# Patient Record
Sex: Female | Born: 1960 | Race: White | Smoking: Never smoker
Health system: Northeastern US, Academic
[De-identification: ages and names within clinical notes are randomized; demographics above are authoritative.]

## PROBLEM LIST (undated history)

## (undated) DIAGNOSIS — F509 Eating disorder, unspecified: Secondary | ICD-10-CM

## (undated) DIAGNOSIS — E785 Hyperlipidemia, unspecified: Secondary | ICD-10-CM

## (undated) DIAGNOSIS — A491 Streptococcal infection, unspecified site: Secondary | ICD-10-CM

## (undated) DIAGNOSIS — J309 Allergic rhinitis, unspecified: Secondary | ICD-10-CM

## (undated) DIAGNOSIS — J069 Acute upper respiratory infection, unspecified: Secondary | ICD-10-CM

## (undated) DIAGNOSIS — W57XXXA Bitten or stung by nonvenomous insect and other nonvenomous arthropods, initial encounter: Secondary | ICD-10-CM

## (undated) DIAGNOSIS — R5383 Other fatigue: Secondary | ICD-10-CM

## (undated) DIAGNOSIS — E039 Hypothyroidism, unspecified: Secondary | ICD-10-CM

## (undated) DIAGNOSIS — H9202 Otalgia, left ear: Secondary | ICD-10-CM

## (undated) DIAGNOSIS — N83209 Unspecified ovarian cyst, unspecified side: Secondary | ICD-10-CM

## (undated) DIAGNOSIS — R059 Cough, unspecified: Secondary | ICD-10-CM

## (undated) DIAGNOSIS — B349 Viral infection, unspecified: Secondary | ICD-10-CM

## (undated) DIAGNOSIS — G4700 Insomnia, unspecified: Secondary | ICD-10-CM

## (undated) DIAGNOSIS — U071 COVID-19: Secondary | ICD-10-CM

## (undated) DIAGNOSIS — K37 Unspecified appendicitis: Secondary | ICD-10-CM

## (undated) DIAGNOSIS — K5900 Constipation, unspecified: Secondary | ICD-10-CM

## (undated) DIAGNOSIS — F419 Anxiety disorder, unspecified: Secondary | ICD-10-CM

## (undated) DIAGNOSIS — B2700 Gammaherpesviral mononucleosis without complication: Secondary | ICD-10-CM

## (undated) DIAGNOSIS — K219 Gastro-esophageal reflux disease without esophagitis: Secondary | ICD-10-CM

## (undated) DIAGNOSIS — F32A Depression, unspecified: Secondary | ICD-10-CM

## (undated) DIAGNOSIS — F1020 Alcohol dependence, uncomplicated: Secondary | ICD-10-CM

## (undated) DIAGNOSIS — J45909 Unspecified asthma, uncomplicated: Secondary | ICD-10-CM

## (undated) DIAGNOSIS — M940 Chondrocostal junction syndrome [Tietze]: Secondary | ICD-10-CM

## (undated) DIAGNOSIS — N951 Menopausal and female climacteric states: Secondary | ICD-10-CM

## (undated) DIAGNOSIS — R7989 Other specified abnormal findings of blood chemistry: Secondary | ICD-10-CM

## (undated) DIAGNOSIS — J029 Acute pharyngitis, unspecified: Secondary | ICD-10-CM

## (undated) DIAGNOSIS — F39 Unspecified mood [affective] disorder: Secondary | ICD-10-CM

## (undated) DIAGNOSIS — M6740 Ganglion, unspecified site: Secondary | ICD-10-CM

## (undated) HISTORY — DX: Allergic rhinitis, unspecified: J30.9

## (undated) HISTORY — DX: Bitten or stung by nonvenomous insect and other nonvenomous arthropods, initial encounter: W57.XXXA

## (undated) HISTORY — DX: Unspecified ovarian cyst, unspecified side: N83.209

## (undated) HISTORY — PX: PELVIC LAPAROSCOPY: SHX162

## (undated) HISTORY — DX: Gammaherpesviral mononucleosis without complication: B27.00

## (undated) HISTORY — DX: Hyperlipidemia, unspecified: E78.5

## (undated) HISTORY — DX: Otalgia, left ear: H92.02

## (undated) HISTORY — DX: Other specified abnormal findings of blood chemistry: R79.89

## (undated) HISTORY — PX: APPENDECTOMY: SHX54

## (undated) HISTORY — DX: COVID-19: U07.1

## (undated) HISTORY — DX: Streptococcal infection, unspecified site: A49.1

## (undated) HISTORY — DX: Acute pharyngitis, unspecified: J02.9

## (undated) HISTORY — DX: Eating disorder, unspecified: F50.9

## (undated) HISTORY — DX: Insomnia, unspecified: G47.00

## (undated) HISTORY — DX: Menopausal and female climacteric states: N95.1

## (undated) HISTORY — DX: Unspecified appendicitis: K37

## (undated) HISTORY — DX: Constipation, unspecified: K59.00

## (undated) HISTORY — DX: Other fatigue: R53.83

## (undated) HISTORY — DX: Depression, unspecified: F32.A

## (undated) HISTORY — DX: Unspecified asthma, uncomplicated: J45.909

## (undated) HISTORY — DX: Gastro-esophageal reflux disease without esophagitis: K21.9

## (undated) HISTORY — DX: Chondrocostal junction syndrome (tietze): M94.0

## (undated) HISTORY — DX: Cough, unspecified: R05.9

## (undated) HISTORY — DX: Viral infection, unspecified: B34.9

## (undated) HISTORY — DX: Unspecified mood (affective) disorder: F39

## (undated) HISTORY — DX: Hypothyroidism, unspecified: E03.9

## (undated) HISTORY — DX: Alcohol dependence, uncomplicated: F10.20

## (undated) HISTORY — DX: Acute upper respiratory infection, unspecified: J06.9

## (undated) HISTORY — DX: Ganglion, unspecified site: M67.40

---

## 2006-10-21 ENCOUNTER — Other Ambulatory Visit: Admission: RE | Admit: 2006-10-21 | Discharge: 2006-10-21 | Payer: Self-pay | Admitting: Obstetrics and Gynecology

## 2008-04-14 ENCOUNTER — Other Ambulatory Visit: Admission: RE | Admit: 2008-04-14 | Discharge: 2008-04-14 | Payer: Self-pay | Admitting: Obstetrics and Gynecology

## 2008-04-18 ENCOUNTER — Ambulatory Visit: Payer: Self-pay | Admitting: Obstetrics and Gynecology

## 2008-04-21 ENCOUNTER — Ambulatory Visit (HOSPITAL_COMMUNITY): Admission: RE | Admit: 2008-04-21 | Discharge: 2008-04-21 | Payer: Self-pay | Admitting: Obstetrics and Gynecology

## 2008-04-25 ENCOUNTER — Ambulatory Visit: Payer: Self-pay | Admitting: Obstetrics and Gynecology

## 2008-04-27 ENCOUNTER — Encounter: Admission: RE | Admit: 2008-04-27 | Discharge: 2008-04-27 | Payer: Self-pay | Admitting: Obstetrics and Gynecology

## 2010-04-23 ENCOUNTER — Ambulatory Visit: Payer: Self-pay | Admitting: Obstetrics and Gynecology

## 2010-04-23 ENCOUNTER — Other Ambulatory Visit: Admission: RE | Admit: 2010-04-23 | Discharge: 2010-04-23 | Payer: Self-pay | Admitting: Obstetrics and Gynecology

## 2010-06-10 ENCOUNTER — Encounter: Admission: RE | Admit: 2010-06-10 | Discharge: 2010-06-10 | Payer: Self-pay | Admitting: Obstetrics and Gynecology

## 2010-06-19 ENCOUNTER — Encounter: Admission: RE | Admit: 2010-06-19 | Discharge: 2010-06-19 | Payer: Self-pay | Admitting: Obstetrics and Gynecology

## 2010-09-02 ENCOUNTER — Ambulatory Visit
Admission: RE | Admit: 2010-09-02 | Discharge: 2010-09-02 | Payer: Self-pay | Source: Home / Self Care | Attending: Vascular Surgery | Admitting: Vascular Surgery

## 2010-09-02 ENCOUNTER — Ambulatory Visit: Admit: 2010-09-02 | Payer: Self-pay | Admitting: Vascular Surgery

## 2010-09-10 NOTE — Procedures (Unsigned)
LOWER EXTREMITY VENOUS REFLUX EXAM  INDICATION:  Varicose veins.  EXAM:  Using color-flow imaging and pulse Doppler spectral analysis, the right common femoral, superficial femoral, popliteal, posterior tibial, greater and lesser saphenous veins are evaluated.  There is no evidence suggesting deep venous insufficiency in the right lower extremity.  The right saphenofemoral junction is not competent with Reflux of >52milliseconds. The right GSV is competent.  The right proximal short saphenous vein demonstrates competency.  GSV Diameter (used if found to be incompetent only)                                           Right          Left Proximal Greater Saphenous Vein           0.66 cm        cm Proximal-to-mid-thigh                     cm             cm Mid thigh                                 cm             cm Mid-distal thigh                          cm             cm Distal thigh                              cm             cm Knee                                      cm             cm  IMPRESSION: 1. Right greater saphenous vein is competent. 2. The right greater saphenous vein is not tortuous. 3. The deep venous system is competent. 4. The right short saphenous vein is competent.  ___________________________________________ Quita Skye. Hart Rochester, M.D.  OD/MEDQ  D:  09/03/2010  T:  09/03/2010  Job:  [id number]

## 2010-11-29 ENCOUNTER — Other Ambulatory Visit: Payer: Self-pay | Admitting: Obstetrics and Gynecology

## 2010-11-29 DIAGNOSIS — Z09 Encounter for follow-up examination after completed treatment for conditions other than malignant neoplasm: Secondary | ICD-10-CM

## 2010-12-04 ENCOUNTER — Ambulatory Visit
Admission: RE | Admit: 2010-12-04 | Discharge: 2010-12-04 | Disposition: A | Discharge status: Home / Self Care | Payer: BC Managed Care – PPO | Source: Ambulatory Visit | Attending: Obstetrics and Gynecology | Admitting: Obstetrics and Gynecology

## 2010-12-04 DIAGNOSIS — Z09 Encounter for follow-up examination after completed treatment for conditions other than malignant neoplasm: Secondary | ICD-10-CM

## 2010-12-31 NOTE — Consult Note (Signed)
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  She has had no history of thrombophlebitis, deep vein thrombosis,  skin ulceration, bleeding or distal edema.  She does have a throbbing  discomfort in this area which radiates down the thigh which worsens as  the day progresses.  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  She does  not wear elastic compression stockings but does occasionally take  ibuprofen and elevate the leg with some improvement.   CHRONIC MEDICAL PROBLEMS:  Denies diabetes, hypertension, coronary  artery disease, COPD or stroke.  Does have some borderline  hyperlipidemia.   SOCIAL HISTORY:  She is single, has 2 children.  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  Does not use tobacco or alcohol.   FAMILY HISTORY:  Positive for TIAs and diabetes in her mother and  coronary artery disease in a brother.   REVIEW OF SYSTEMS:  Positive for leg discomfort with walking, joint  pain.  History of ADD as well as depression and anxiety which has been  treated.  All other systems in a complete review of systems are  negative.   PHYSICAL EXAMINATION:  Blood pressure 116/68, heart rate 67,  respirations 20.  General:  She is a well-developed, well-nourished thin  female who is in no apparent distress.  She is alert and oriented x3.  HEENT:  Normal for age.  EOMs intact.  Lungs:  Clear to auscultation.  No rhonchi or wheezing.  Cardiovascular:  Regular rhythm.  No murmurs.  Carotid pulses 3+.  No audible bruits.  Abdomen:  Soft, nontender with  no masses.  Musculoskeletal:  Free of major  deformities.  Neurologic:  Normal.  Skin:  Free of rashes.  Extremities:  Lower extremity exam  reveals 3+ femoral, popliteal and dorsalis pedis pulses palpable.  There  is no distal edema,  hyperpigmentation or ulceration.  No bulging  varicosities are noted.  She does have a linear, reticular-type vein  which begins at the right saphenofemoral junction and extends anteriorly  down the thigh and lateral to the knee.   I ordered venous duplex exam which I reviewed and interpreted also  performed a bedside SonoSite ultrasound exam.  She had some mild reflux  at the right saphenofemoral junction only.  There is no evidence of DVT,  and the deep systems are normal.  The rest of the great saphenous system  is normal.   I explained to her that this is not a dangerous situation that requires  definitive treatment at this time.  Treatment option would be  sclerotherapy which could be easily accomplished.  She does not need any  laser ablation treatment of her saphenous system.  She could develop  recurrent reticular veins since no treatment of underlying high venous  pressures will be performed since none can be identified.  She will  decide whether she would like to  proceed with sclerotherapy.     Quita Skye Hart Rochester, M.D.  Electronically Signed   JDL/MEDQ  D:  09/02/2010  T:  09/03/2010  Job:  [id number]   cc:   Reuel Boom L. Eda Paschal, M.D.

## 2011-08-25 ENCOUNTER — Encounter: Payer: Self-pay | Admitting: Gynecology

## 2011-08-25 DIAGNOSIS — N83209 Unspecified ovarian cyst, unspecified side: Secondary | ICD-10-CM | POA: Insufficient documentation

## 2011-08-26 ENCOUNTER — Ambulatory Visit (INDEPENDENT_AMBULATORY_CARE_PROVIDER_SITE_OTHER): Payer: BC Managed Care – PPO | Admitting: Obstetrics and Gynecology

## 2011-08-26 ENCOUNTER — Other Ambulatory Visit: Payer: Self-pay | Admitting: Obstetrics and Gynecology

## 2011-08-26 DIAGNOSIS — N390 Urinary tract infection, site not specified: Secondary | ICD-10-CM

## 2011-08-26 DIAGNOSIS — R319 Hematuria, unspecified: Secondary | ICD-10-CM

## 2011-08-26 LAB — URINALYSIS, ROUTINE W REFLEX MICROSCOPIC
Bilirubin Urine: NEGATIVE
Ketones, ur: NEGATIVE mg/dL
Nitrite: NEGATIVE
Specific Gravity, Urine: 1.01 (ref 1.005–1.030)
Urobilinogen, UA: 0.2 mg/dL (ref 0.0–1.0)

## 2011-08-26 LAB — URINALYSIS, MICROSCOPIC ONLY: Casts: NONE SEEN

## 2011-08-26 NOTE — Progress Notes (Signed)
The patient came to see me today because on Sunday and Monday she noticed a reddish discharge related to urinating and she thought she might have a urinary tract infection. She is having no dysuria, frequency, or urgency. She is midcycle and has had no abnormal bleeding. Today she is asymptomatic.  Pelvic exam: External within normal limits. BUS within normal limits. Vaginal exam within normal limits. Cervix is clean without lesions. Uterus is normal size and shape. Adnexa failed to reveal masses. Rectovaginal examination is confirmatory and without masses. Pelvic exam: External within normal limits. BUS within normal limits. Vaginal exam within normal limits. Cervix is clean without lesions. Uterus is normal size and shape. Adnexa failed to reveal masses. Rectovaginal examination is confirmatory and without masses. Kim gardner present. Urinalysis showed 3-6 white blood cells and 0-2 red blood cells. Bacteria showed a few.  Assessment: Possible urinary tract infection  Plan: Since patient is asymptomatic we've asked her to force fluids and wait for her urine culture results before we treat her.

## 2011-08-28 LAB — URINE CULTURE: Organism ID, Bacteria: NO GROWTH

## 2011-09-08 ENCOUNTER — Encounter: Payer: BC Managed Care – PPO | Admitting: Obstetrics and Gynecology

## 2011-10-23 ENCOUNTER — Telehealth: Payer: Self-pay | Admitting: *Deleted

## 2011-10-23 NOTE — Telephone Encounter (Signed)
Pt left message c/o no period in 43 days. Pt is overdue for her annual (jan 2013) left message for pt to call appointment desk to make OV.

## 2011-11-11 ENCOUNTER — Ambulatory Visit (INDEPENDENT_AMBULATORY_CARE_PROVIDER_SITE_OTHER): Payer: BC Managed Care – PPO | Admitting: Obstetrics and Gynecology

## 2011-11-11 ENCOUNTER — Other Ambulatory Visit (HOSPITAL_COMMUNITY)
Admission: RE | Admit: 2011-11-11 | Discharge: 2011-11-11 | Disposition: A | Discharge status: Home / Self Care | Payer: BC Managed Care – PPO | Source: Ambulatory Visit | Attending: Obstetrics and Gynecology | Admitting: Obstetrics and Gynecology

## 2011-11-11 ENCOUNTER — Encounter: Payer: Self-pay | Admitting: Obstetrics and Gynecology

## 2011-11-11 ENCOUNTER — Other Ambulatory Visit: Payer: Self-pay | Admitting: Obstetrics and Gynecology

## 2011-11-11 VITALS — BP 110/64 | Ht 67.5 in | Wt 125.0 lb

## 2011-11-11 DIAGNOSIS — Z01419 Encounter for gynecological examination (general) (routine) without abnormal findings: Secondary | ICD-10-CM | POA: Insufficient documentation

## 2011-11-11 DIAGNOSIS — N912 Amenorrhea, unspecified: Secondary | ICD-10-CM

## 2011-11-11 DIAGNOSIS — Z113 Encounter for screening for infections with a predominantly sexual mode of transmission: Secondary | ICD-10-CM

## 2011-11-11 LAB — URINALYSIS W MICROSCOPIC + REFLEX CULTURE
Bilirubin Urine: NEGATIVE
Glucose, UA: NEGATIVE mg/dL
Protein, ur: NEGATIVE mg/dL
Urobilinogen, UA: 0.2 mg/dL (ref 0.0–1.0)
WBC, UA: NONE SEEN WBC/hpf (ref <3)

## 2011-11-11 LAB — FOLLICLE STIMULATING HORMONE: FSH: 105.9 m[IU]/mL

## 2011-11-11 MED ORDER — MEDROXYPROGESTERONE ACETATE 10 MG PO TABS: 10.0000 mg | ORAL_TABLET | Freq: Every day | ORAL | Status: AC

## 2011-11-11 NOTE — Progress Notes (Addendum)
Patient came to see me today for her annual exam. She has not had a period since January. She is having more hot flashes. I do not believe her symptoms bother her not to require intervention. She does get some PMS however when she is late for her period. She is overdue for short-term followup of her mammogram due to calcifications. She is having no pelvic pain. She is not sexually active at the moment. She did have intercourse in December. We discussed checking her for gonorrhea and Chlamydia and she is comfortable having [place] do so.  Physical examination:  Kennon Portela present. HEENT within normal limits. Neck: Thyroid not large. No masses. Supraclavicular nodes: not enlarged. Breasts: Examined in both sitting and lying  position. No skin changes and no masses. Abdomen: Soft no guarding rebound or masses or hernia. Pelvic: External: Within normal limits. BUS: Within normal limits. Vaginal:within normal limits. Good estrogen effect. No evidence of cystocele rectocele or enterocele. Cervix: clean. Uterus: Normal size and shape. Adnexa: No masses. Rectovaginal exam: Confirmatory and negative. Extremities: Within normal limits.  Assessment: #1. Transitional symptoms #2. Secondary amenorrhea #3. Microcalcifications of breasts  Plan: Patient is scheduled mammogram. Discussed periodic Provera withdrawal. Prescription written. She will do more often if needed for PMS. FSH checked.  I call patient back and told her her FSH was in the menopausal range. For the moment she declined HRT. She will scheduled bone density. She was a smoker with an eating disorder when she was younger so she is at some risk. She will continue to contracept with condoms.

## 2011-11-12 LAB — GC/CHLAMYDIA PROBE AMP, GENITAL
Chlamydia, DNA Probe: NEGATIVE
GC Probe Amp, Genital: NEGATIVE

## 2011-11-12 NOTE — Progress Notes (Signed)
Addended by: Trellis Paganini on: 11/12/2011 09:55 AM   Modules accepted: Orders

## 2011-11-14 IMAGING — MG MM DIGITAL SCREENING
4 series · 4 of 4 positions shown · non-contrast
Comparison: none

DG SCREEN MAMMOGRAM BILATERAL
Bilateral CC and MLO view(s) were taken.

DIGITAL SCREENING MAMMOGRAM WITH CAD:
The breast tissue is heterogeneously dense.  Microcalcifications are present in the left breast.  
Characterization with magnification views is recommended.  No mass or malignant type calcifications
are identified in the right breast.  Compared with prior studies.
Images were processed with CAD.

[R CC]
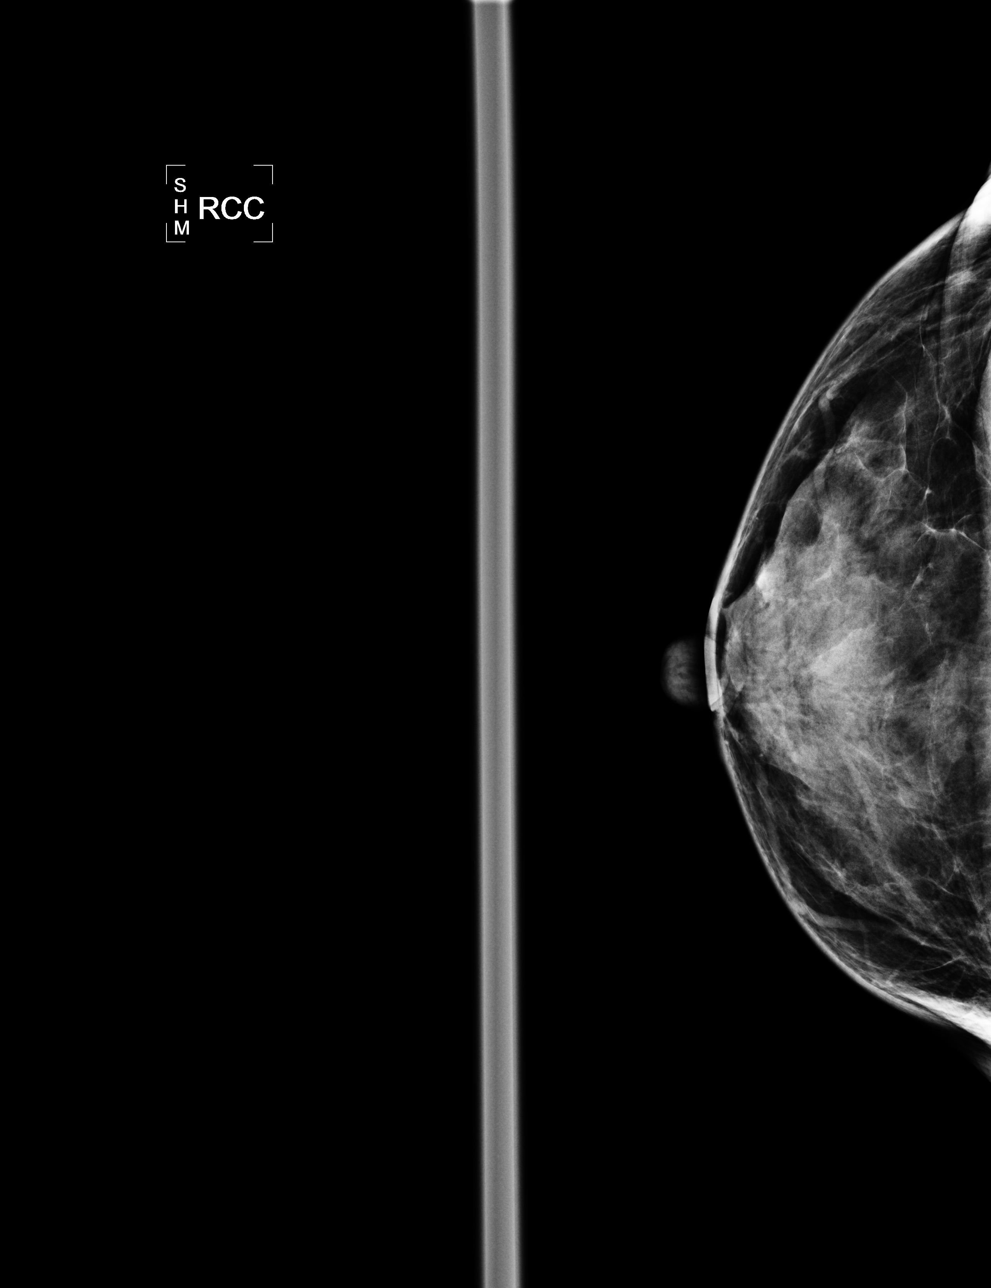

[L CC]
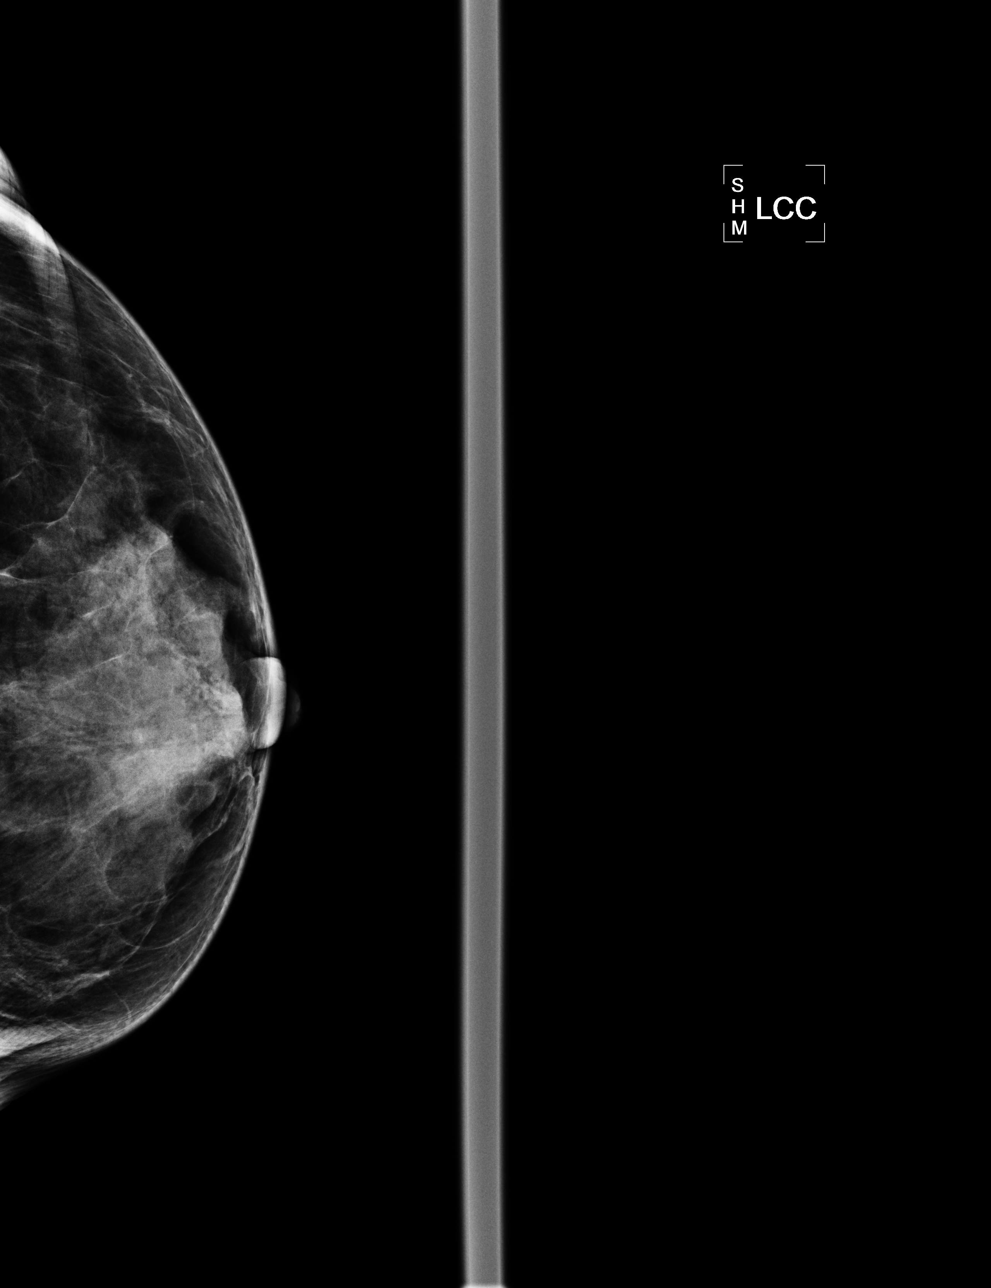

[L MLO]
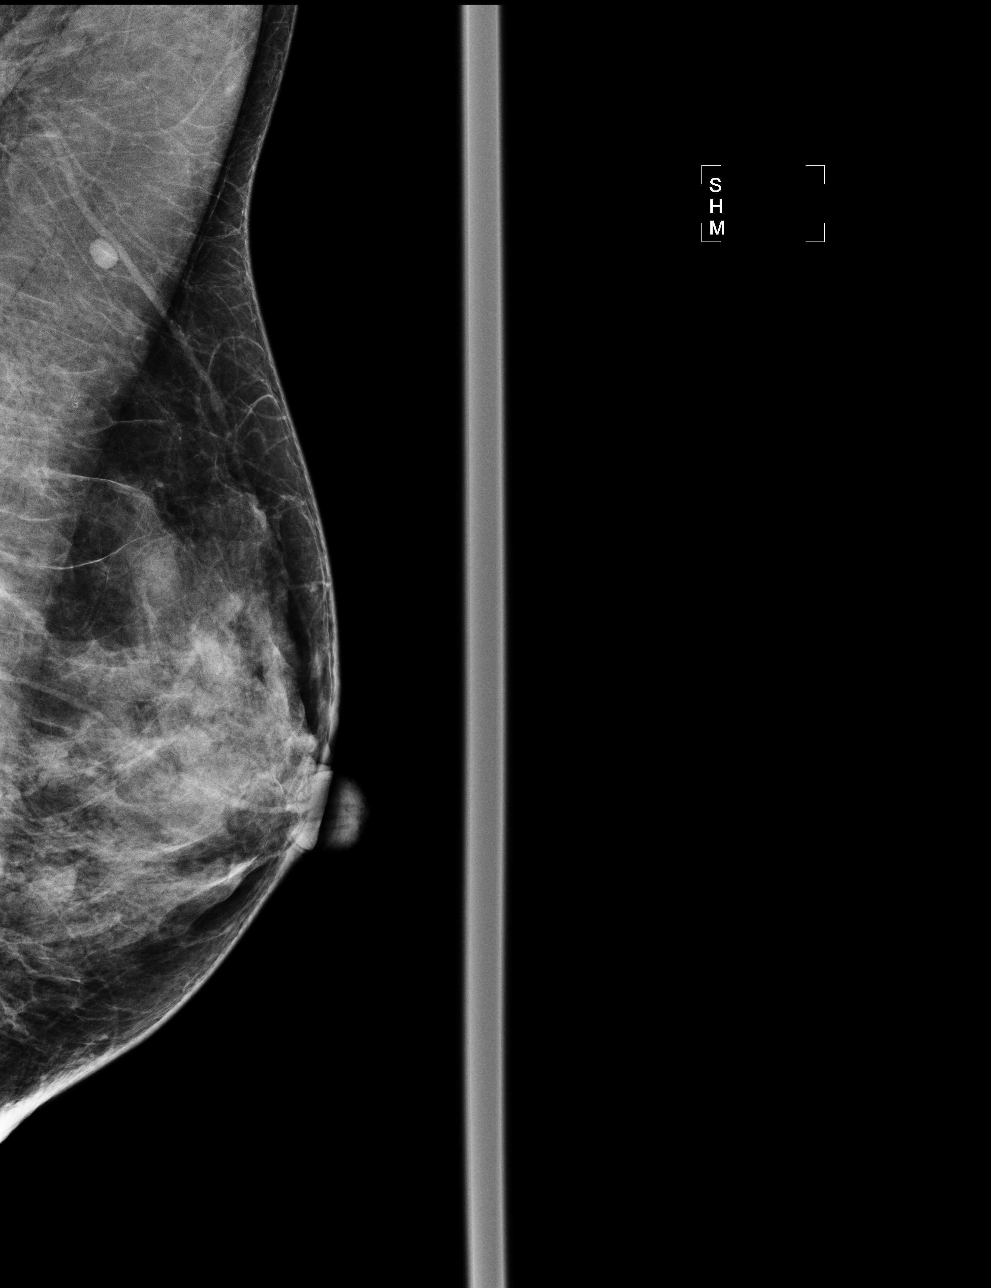

[R MLO]
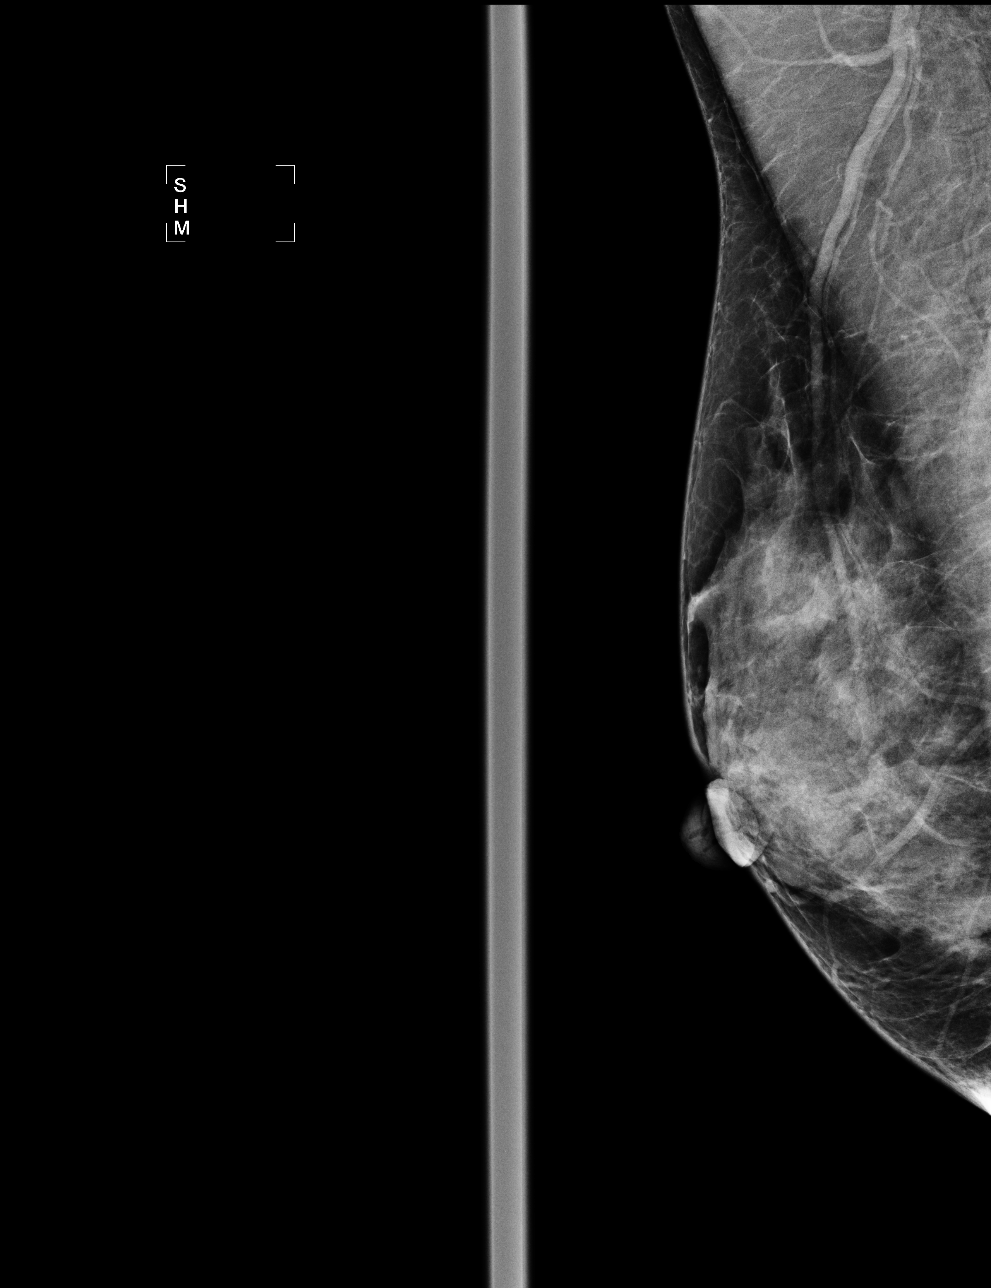

[4 of 4 positions shown; findings below may reference images not displayed]

IMPRESSION: Calcifications, left breast.  Additional evaluation is indicated. The patient will be contacted for
additional studies and a supplementary report will follow.  No specific mammographic evidence of 
malignancy, right breast.

ASSESSMENT: Need additional imaging evaluation and/or prior mammograms for comparison - BI-RADS 0

Further imaging of the left breast.
,

## 2011-12-01 ENCOUNTER — Telehealth: Payer: Self-pay | Admitting: Obstetrics and Gynecology

## 2011-12-01 ENCOUNTER — Telehealth: Payer: Self-pay | Admitting: *Deleted

## 2011-12-01 NOTE — Telephone Encounter (Signed)
Have her come by for her lab work but she should see me first. She needs to be placed in the computer for office visit.

## 2011-12-01 NOTE — Telephone Encounter (Signed)
Pt informed with the below note. 

## 2011-12-01 NOTE — Telephone Encounter (Signed)
Pt called requesting to start on birth control pills, per last office note pt is in menopause and is very nervous about getting pregnant. She also wanted to come to have testing done to check for herpes, pt said she has unprotected sex and would like this done for piece of mind. Please advise

## 2011-12-01 NOTE — Telephone Encounter (Signed)
OPENED IN ERROR.WL

## 2011-12-02 ENCOUNTER — Ambulatory Visit (INDEPENDENT_AMBULATORY_CARE_PROVIDER_SITE_OTHER): Payer: BC Managed Care – PPO | Admitting: Obstetrics and Gynecology

## 2011-12-02 DIAGNOSIS — B009 Herpesviral infection, unspecified: Secondary | ICD-10-CM

## 2011-12-02 DIAGNOSIS — N912 Amenorrhea, unspecified: Secondary | ICD-10-CM

## 2011-12-02 NOTE — Progress Notes (Signed)
Patient came to see me today for several reasons. In March of this year her [facility] was 105 and I told her she was menopausal. However I told her she should contracept for at least one year. She would like to go on birth control pills. She is also having some menopausal symptoms and we discussed that this would help them as well. She is concerned however because she has been having unprotected sex. She also is worried that she may have herpes although she's had no symptoms. She does have a previous history of a partner who had herpes. We decided to day to check her for HSV 1 and 2 both IgG and IgM because of the above. She will then return after 10 days no sexual activity and get a qualitative hCG to be sure. The next day she will start lo loestrin but use backup birthcontrol for one cycle. She was given two months of samples.

## 2011-12-10 ENCOUNTER — Other Ambulatory Visit: Payer: BC Managed Care – PPO

## 2011-12-10 DIAGNOSIS — N912 Amenorrhea, unspecified: Secondary | ICD-10-CM

## 2011-12-11 ENCOUNTER — Telehealth: Payer: Self-pay | Admitting: *Deleted

## 2011-12-11 LAB — HCG, SERUM, QUALITATIVE: Preg, Serum: NEGATIVE

## 2011-12-11 NOTE — Telephone Encounter (Signed)
Left message in pt voicemail with negative HCG level results.

## 2012-01-07 ENCOUNTER — Telehealth: Payer: Self-pay | Admitting: *Deleted

## 2012-01-07 MED ORDER — MEGESTROL ACETATE 40 MG PO TABS: 40.0000 mg | ORAL_TABLET | Freq: Every day | ORAL | Status: AC

## 2012-01-07 NOTE — Telephone Encounter (Signed)
Joan reread my note.

## 2012-01-07 NOTE — Telephone Encounter (Signed)
Add Megace 40 mg daily but continue birth control pill as well. At end of white pills in fourth row stop megace. Restart next pack of birth control pills without Megace. If happens again next cycle we will switch birth control pills.

## 2012-01-07 NOTE — Telephone Encounter (Signed)
Addended by: Aura Camps on: 01/07/2012 03:15 PM   Modules accepted: Orders

## 2012-01-07 NOTE — Telephone Encounter (Signed)
Left message on pt voicemail

## 2012-01-07 NOTE — Telephone Encounter (Signed)
How long should pt take megace 40 daily?

## 2012-01-07 NOTE — Telephone Encounter (Signed)
Pt is in menopause was given samples of lo Loestrin pills at last OV 12/02/11 because she was concerned about getting pregnant. Pt is on her first pack of pill on 3 rd row and c/o heavy bleeding for about 2 weeks now. She has been changing tampons every 45 min's to an hour. Bleeding started off slow and the being heavy and has continued. Pt would like to know what she can do about the bleeding. Please advise

## 2012-01-09 NOTE — Telephone Encounter (Signed)
Late entry, pt called later on 01/07/12 at 4:50 stating she was on her last row of two white pills? Left message for pt to call

## 2012-01-30 ENCOUNTER — Telehealth: Payer: Self-pay | Admitting: *Deleted

## 2012-01-30 NOTE — Telephone Encounter (Signed)
Pt called requesting 1 month sample pack of lo Loestrin Fe left at front desk for pt.

## 2012-04-14 ENCOUNTER — Telehealth: Payer: Self-pay | Admitting: *Deleted

## 2012-04-14 NOTE — Telephone Encounter (Signed)
Pt is calling c/o ireegular bleeding while on her lo Loestrin Fe, pt said she had cycle in July with bleeding for 2 weeks then off a week. LMP: 04/11/12 pt would like to switch to another pill as noted on 01/07/12 telephone encounter. Please advise

## 2012-04-15 MED ORDER — MEGESTROL ACETATE 40 MG PO TABS: 40.0000 mg | ORAL_TABLET | Freq: Two times a day (BID) | ORAL | Status: AC

## 2012-04-15 NOTE — Addendum Note (Signed)
Addended by: Aura Camps on: 04/15/2012 10:59 AM   Modules accepted: Orders

## 2012-04-15 NOTE — Telephone Encounter (Signed)
Pt is in her second row of her pill pack, tomorrow will she will be in her 3 rd row. She asked if this could be related to menopause or just maybe the pills? She is taking pills because she is nervous about getting pregnant at her age. The bleeding has been heavy with lots of cramping. She is willing to do whatever you think is best.

## 2012-04-15 NOTE — Telephone Encounter (Signed)
Left on pt voicemail to make ov and the below as well, rx sent.

## 2012-04-15 NOTE — Telephone Encounter (Signed)
Switch to Ovcon-35. Can use generic. I assume she has not started new pack yet since her period just started. If this is correct start ovcon now. If she has started new pack let me know. If any abnormal bleeding this month with ovcon needs office visit. Please let me know where she is in pills today.

## 2012-04-15 NOTE — Telephone Encounter (Signed)
Have her stay on current pill. Add Megace 40 mg twice a day with pill. This will temporarily stop bleeding. Office visit next week to rediscuss issues.

## 2012-04-22 ENCOUNTER — Ambulatory Visit (INDEPENDENT_AMBULATORY_CARE_PROVIDER_SITE_OTHER): Payer: BC Managed Care – PPO | Admitting: Obstetrics and Gynecology

## 2012-04-22 DIAGNOSIS — N949 Unspecified condition associated with female genital organs and menstrual cycle: Secondary | ICD-10-CM

## 2012-04-22 DIAGNOSIS — N938 Other specified abnormal uterine and vaginal bleeding: Secondary | ICD-10-CM

## 2012-04-22 NOTE — Progress Notes (Signed)
Patient came to see me today because of persistent dysfunctional uterine bleeding on low Loestrin which she has been on since April. She had an elevated FSH but has an active sexual life and since I couldn't guarantee her that she could not conceive since she had just stopped having cycles she elected to do birth control pills.  Exam: Kennon Portela present. Pelvic exam: External within normal limits. BUS within normal limits. Vaginal exam within normal limits. Cervix is clean without lesions. Uterus is normal size and shape. Adnexa failed to reveal masses. Rectovaginal examination is confirmatory and without masses.   Assessment: Dysfunctional uterine bleeding on birth control pills.  Plan: Endometrial biopsy done. Saline infusion histogram scheduled. We discussed increasing the dose of her birth control pill but instead she elected to do a  Mirena IUD. Assuming biopsy is normal and  SIH is normal we will try to insert it  before her next cycle.

## 2012-04-22 NOTE — Patient Instructions (Addendum)
Schedule saline infusion histogram. 

## 2012-04-23 ENCOUNTER — Other Ambulatory Visit: Payer: Self-pay | Admitting: Obstetrics and Gynecology

## 2012-04-23 ENCOUNTER — Encounter: Payer: Self-pay | Admitting: Obstetrics and Gynecology

## 2012-04-23 ENCOUNTER — Ambulatory Visit (INDEPENDENT_AMBULATORY_CARE_PROVIDER_SITE_OTHER): Payer: BC Managed Care – PPO

## 2012-04-23 ENCOUNTER — Ambulatory Visit (INDEPENDENT_AMBULATORY_CARE_PROVIDER_SITE_OTHER): Payer: BC Managed Care – PPO | Admitting: Obstetrics and Gynecology

## 2012-04-23 DIAGNOSIS — N938 Other specified abnormal uterine and vaginal bleeding: Secondary | ICD-10-CM

## 2012-04-23 DIAGNOSIS — N949 Unspecified condition associated with female genital organs and menstrual cycle: Secondary | ICD-10-CM

## 2012-04-23 NOTE — Progress Notes (Signed)
Patient came back to see me today for her saline infusion histogram. Her endometrial biopsy is not back yet. Her uterus shows a single 1 cm fibroid in the myometrium. Her endometrial echo is 3.9 mm. Both her ovaries appeared normal. Her cul-de-sac is free of fluid. A catheter was placed in her uterus and saline was infused. She had a symmetrical cavity without lesion. She was reassured. She'll return next week for Mirena IUD.

## 2012-04-23 NOTE — Patient Instructions (Signed)
Return for IUD next week.

## 2012-04-28 ENCOUNTER — Other Ambulatory Visit: Payer: Self-pay | Admitting: Obstetrics and Gynecology

## 2012-04-28 ENCOUNTER — Ambulatory Visit (INDEPENDENT_AMBULATORY_CARE_PROVIDER_SITE_OTHER): Payer: BC Managed Care – PPO | Admitting: Obstetrics and Gynecology

## 2012-04-28 DIAGNOSIS — Z30431 Encounter for routine checking of intrauterine contraceptive device: Secondary | ICD-10-CM

## 2012-04-28 DIAGNOSIS — Z3049 Encounter for surveillance of other contraceptives: Secondary | ICD-10-CM

## 2012-04-28 MED ORDER — LEVONORGESTREL 20 MCG/24HR IU IUD: INTRAUTERINE_SYSTEM | Freq: Once | INTRAUTERINE | Status: DC

## 2012-04-28 NOTE — Patient Instructions (Signed)
Return in 6 weeks

## 2012-04-28 NOTE — Progress Notes (Signed)
Patient came in for Mirena IUD insertion. She is on day 5 of her cycle it is still bleeding heavily. She was examined and has a normal pelvic exam. The cervix was swabbed with Betadine. A single-tooth tenaculum was placed on the anterior lip of the cervix. Uterus sounded to 7.5 cm. A Mirena IUD was inserted with ease. She was instructed on feeling the string. She will return in 6 weeks.

## 2012-04-29 ENCOUNTER — Other Ambulatory Visit: Payer: Self-pay | Admitting: Obstetrics and Gynecology

## 2012-04-29 DIAGNOSIS — Z30431 Encounter for routine checking of intrauterine contraceptive device: Secondary | ICD-10-CM

## 2012-05-25 ENCOUNTER — Telehealth: Payer: Self-pay | Admitting: *Deleted

## 2012-05-25 NOTE — Telephone Encounter (Signed)
The problem with taking Megace is that when she finishes that she will always bleed again. It is normal to have some bleeding for first 60 days with IUD. I want to see her 6 weeks after the IUD was inserted. I would only take Megace for heavy bleeding.

## 2012-05-25 NOTE — Telephone Encounter (Signed)
Left the below note on pt voicemail. 

## 2012-05-25 NOTE — Telephone Encounter (Signed)
Pt had IUD placed on 04/28/12. Pt had some bleeding this week and took some megace that she had left. Spotting now, I told pt not usual to having bleeding with IUD. She took megace x 1 week, pt asked if bleeding should occur would you like to her to take the megace or okay to bleed as long as bleeding not extremely heavy? Please advise

## 2012-06-09 ENCOUNTER — Ambulatory Visit: Payer: BC Managed Care – PPO | Admitting: Obstetrics and Gynecology

## 2012-06-29 ENCOUNTER — Ambulatory Visit (INDEPENDENT_AMBULATORY_CARE_PROVIDER_SITE_OTHER): Payer: BC Managed Care – PPO | Admitting: Obstetrics and Gynecology

## 2012-06-29 ENCOUNTER — Encounter: Payer: Self-pay | Admitting: Obstetrics and Gynecology

## 2012-06-29 DIAGNOSIS — N951 Menopausal and female climacteric states: Secondary | ICD-10-CM

## 2012-06-29 DIAGNOSIS — R232 Flushing: Secondary | ICD-10-CM

## 2012-06-29 MED ORDER — ESTRADIOL 0.05 MG/24HR TD PTTW: 1.0000 | MEDICATED_PATCH | TRANSDERMAL | Status: DC

## 2012-06-29 NOTE — Patient Instructions (Signed)
Start estrogen patch.

## 2012-06-29 NOTE — Progress Notes (Signed)
Patient came back to see me today. She is doing well with her new Mirena IUD. She had stopped bleeding but restarted today. Her menopausal symptoms are getting worse and she is ready to be treated. They consist of hot flashes and inability to focus.  Pelvic exam: External within normal limits. BUS within normal limits. Vaginal exam within normal limits. Cervix is clean without lesions. IUD string visible. Uterus is normal size and shape. Adnexa failed to reveal masses. Rectovaginal examination is confirmatory and without masses.   Assessment: Menopausal symptoms  Plan: Start her on Vivelle dot patch 0.05 mg twice weekly.

## 2012-11-09 ENCOUNTER — Telehealth: Payer: Self-pay | Admitting: *Deleted

## 2012-11-09 NOTE — Telephone Encounter (Signed)
Call from [facility] at [facility], this patient was previously referred and Dr. Ninetta Lights said she needed to be seen by Rheumatology for [facility] Spotted fever and Lyme disease. Patient was seen by rheum. And they said she needed to be seen by ID. They are refaxing the most recent labs Wendall Mola

## 2012-11-22 ENCOUNTER — Encounter: Payer: Self-pay | Admitting: Infectious Diseases

## 2012-11-22 ENCOUNTER — Ambulatory Visit (INDEPENDENT_AMBULATORY_CARE_PROVIDER_SITE_OTHER): Payer: BC Managed Care – PPO | Admitting: Infectious Diseases

## 2012-11-22 VITALS — BP 102/62 | HR 69 | Temp 98.1°F | Ht 67.5 in | Wt 125.0 lb

## 2012-11-22 DIAGNOSIS — B2790 Infectious mononucleosis, unspecified without complication: Secondary | ICD-10-CM | POA: Insufficient documentation

## 2012-11-22 NOTE — Assessment & Plan Note (Signed)
I reviewed Joan Marshall's labs with her. She has borderline elevated RMSF IgM but negative IgGs.  She has a single equivocal Lyme ab that developed while she was not in an endemic area and not during the active season for ticks. She has negative serologies for tularemia, babesia.  She has markedly elevated antibodies for previous infection with EBV, her acute antibody (IgM) is negative.  She has rheumatologic serologies (ANA, RF, ESR) that are all negative.  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 I encouraged her to continue to exercise (run/yoga) to encourage mobility and stamina. She also can take prn NSAIDS. She can rtc prn.  I suggested she have a repeat HIV test done 6 months after her last (August 2014) and if she has further night sweats (these are improving) to get BCx.

## 2012-11-22 NOTE — Progress Notes (Signed)
  Subjective:    Patient ID: Joan Marshall, female    DOB: [birth year], 52 y.o.   MRN: [id number]  HPI 52 yo F with hx of night sweats, body/joint aches (mostly legs and lower back), since Sept 2013, repeat episode in November. No joint swelling.  Was tested for RMSF and Lyme . Has taken 2 months of doxy.  Was seen by rheum at [facility] but told she was fine.  Has been out of work 1 day/week due to fatigue. No fevers (always 98).  She has no recollection of a tick bite, rash. She has 2 dogs at home, no other animal exposures.  Soc- grew up in [place], last lived there in 2005.  Divorced, new partner last year. Recently tested HIV (-) 09-21-12.   Review of Systems  Constitutional: Positive for fatigue. Negative for appetite change and unexpected weight change.  HENT: Negative for mouth sores.   Respiratory: Positive for shortness of breath.        [place] in [place]   Gastrointestinal: Negative for diarrhea and constipation.  Genitourinary: Negative for difficulty urinating.  Skin: Negative for rash.       Objective:   Physical Exam  Constitutional: She appears well-developed and well-nourished.  HENT:  Mouth/Throat: No oropharyngeal exudate.  Eyes: EOM are normal. Pupils are equal, round, and reactive to light.  Neck: Neck supple.  Cardiovascular: Normal rate, regular rhythm and normal heart sounds.   Pulmonary/Chest: Effort normal and breath sounds normal.  Abdominal: Soft. Bowel sounds are normal. There is no tenderness. There is no rebound.  Musculoskeletal: Normal range of motion. She exhibits no edema and no tenderness.  Lymphadenopathy:    She has no cervical adenopathy.    She has no axillary adenopathy.       Left: No supraclavicular adenopathy present.  Psychiatric: She has a normal mood and affect. Her behavior is normal.          Assessment & Plan:

## 2013-02-10 ENCOUNTER — Telehealth: Payer: Self-pay | Admitting: *Deleted

## 2013-02-10 NOTE — Telephone Encounter (Signed)
Pt informed with the below note. 

## 2013-02-10 NOTE — Telephone Encounter (Signed)
Please call, review normal to get brown spotting/maybe her cycle. Has small fibroid as noted on normal sonohysterogram 04/2012. Encourage to take a Motrin for the low backache, office visit if bloating and spotting persists.

## 2013-02-10 NOTE — Telephone Encounter (Signed)
Pt has IUD placed in 2013 with Dr.G, pt is having some bloating and some brown discharge x 6 days now, no odor, no pain, some lower back tenderness, no cramping. She was on vivelle dot patch per note on 06/29/12 but she took herself off because she had lots of bleeding. She now pt is not taking any HRT. She asked if normal? Any recommendations? Please advise

## 2013-03-11 ENCOUNTER — Ambulatory Visit (INDEPENDENT_AMBULATORY_CARE_PROVIDER_SITE_OTHER): Payer: BC Managed Care – PPO | Admitting: Women's Health

## 2013-03-11 ENCOUNTER — Encounter: Payer: Self-pay | Admitting: Women's Health

## 2013-03-11 VITALS — BP 112/68 | Ht 67.5 in | Wt 124.0 lb

## 2013-03-11 DIAGNOSIS — Z780 Asymptomatic menopausal state: Secondary | ICD-10-CM

## 2013-03-11 DIAGNOSIS — F32A Depression, unspecified: Secondary | ICD-10-CM

## 2013-03-11 DIAGNOSIS — Z3049 Encounter for surveillance of other contraceptives: Secondary | ICD-10-CM

## 2013-03-11 DIAGNOSIS — F329 Major depressive disorder, single episode, unspecified: Secondary | ICD-10-CM | POA: Insufficient documentation

## 2013-03-11 DIAGNOSIS — Z01419 Encounter for gynecological examination (general) (routine) without abnormal findings: Secondary | ICD-10-CM

## 2013-03-11 NOTE — Patient Instructions (Signed)
Health Recommendations for Postmenopausal Women Respected and ongoing research has looked at the most common causes of death, disability, and poor quality of life in postmenopausal women. The causes include heart disease, diseases of blood vessels, diabetes, depression, cancer, and bone loss (osteoporosis). Many things can be done to help lower the chances of developing these and other common problems: CARDIOVASCULAR DISEASE Heart Disease: A heart attack is a medical emergency. Know the signs and symptoms of a heart attack. Below are things women can do to reduce their risk for heart disease.   Do not smoke. If you smoke, quit.  Aim for a healthy weight. Being overweight causes many preventable deaths. Eat a healthy and balanced diet and drink an adequate amount of liquids.  Get moving. Make a commitment to be more physically active. Aim for 30 minutes of activity on most, if not all days of the week.  Eat for heart health. Choose a diet that is low in saturated fat and cholesterol and eliminate trans fat. Include whole grains, vegetables, and fruits. Read and understand the labels on food containers before buying.  Know your numbers. Ask your caregiver to check your blood pressure, cholesterol (total, HDL, LDL, triglycerides) and blood glucose. Work with your caregiver on improving your entire clinical picture.  High blood pressure. Limit or stop your table salt intake (try salt substitute and food seasonings). Avoid salty foods and drinks. Read labels on food containers before buying. Eating well and exercising can help control high blood pressure. STROKE  Stroke is a medical emergency. Stroke may be the result of a blood clot in a blood vessel in the brain or by a brain hemorrhage (bleeding). Know the signs and symptoms of a stroke. To lower the risk of developing a stroke:  Avoid fatty foods.  Quit smoking.  Control your diabetes, blood pressure, and irregular heart rate. THROMBOPHLEBITIS  (BLOOD CLOT) OF THE LEG  Becoming overweight and leading a stationary lifestyle may also contribute to developing blood clots. Controlling your diet and exercising will help lower the risk of developing blood clots. CANCER SCREENING  Breast Cancer: Take steps to reduce your risk of breast cancer.  You should practice "breast self-awareness." This means understanding the normal appearance and feel of your breasts and should include breast self-examination. Any changes detected, no matter how small, should be reported to your caregiver.  After age 40, you should have a clinical breast exam (CBE) every year.  Starting at age 40, you should consider having a mammogram (breast X-ray) every year.  If you have a family history of breast cancer, talk to your caregiver about genetic screening.  If you are at high risk for breast cancer, talk to your caregiver about having an MRI and a mammogram every year.  Intestinal or Stomach Cancer: Tests to consider are a rectal exam, fecal occult blood, sigmoidoscopy, and colonoscopy. Women who are high risk may need to be screened at an earlier age and more often.  Cervical Cancer:  Beginning at age 30, you should have a Pap test every 3 years as long as the past 3 Pap tests have been normal.  If you have had past treatment for cervical cancer or a condition that could lead to cancer, you need Pap tests and screening for cancer for at least 20 years after your treatment.  If you had a hysterectomy for a problem that was not cancer or a condition that could lead to cancer, then you no longer need Pap tests.    If you are between ages 65 and 70, and you have had normal Pap tests going back 10 years, you no longer need Pap tests.  If Pap tests have been discontinued, risk factors (such as a new sexual partner) need to be reassessed to determine if screening should be resumed.  Some medical problems can increase the chance of getting cervical cancer. In these  cases, your caregiver may recommend more frequent screening and Pap tests.  Uterine Cancer: If you have vaginal bleeding after reaching menopause, you should notify your caregiver.  Ovarian cancer: Other than yearly pelvic exams, there are no reliable tests available to screen for ovarian cancer at this time except for yearly pelvic exams.  Lung Cancer: Yearly chest X-rays can detect lung cancer and should be done on high risk women, such as cigarette smokers and women with chronic lung disease (emphysema).  Skin Cancer: A complete body skin exam should be done at your yearly examination. Avoid overexposure to the sun and ultraviolet light lamps. Use a strong sun block cream when in the sun. All of these things are important in lowering the risk of skin cancer. MENOPAUSE Menopause Symptoms: Hormone therapy products are effective for treating symptoms associated with menopause:  Moderate to severe hot flashes.  Night sweats.  Mood swings.  Headaches.  Tiredness.  Loss of sex drive.  Insomnia.  Other symptoms. Hormone replacement carries certain risks, especially in older women. Women who use or are thinking about using estrogen or estrogen with progestin treatments should discuss that with their caregiver. Your caregiver will help you understand the benefits and risks. The ideal dose of hormone replacement therapy is not known. The Food and Drug Administration (FDA) has concluded that hormone therapy should be used only at the lowest doses and for the shortest amount of time to reach treatment goals.  OSTEOPOROSIS Protecting Against Bone Loss and Preventing Fracture: If you use hormone therapy for prevention of bone loss (osteoporosis), the risks for bone loss must outweigh the risk of the therapy. Ask your caregiver about other medications known to be safe and effective for preventing bone loss and fractures. To guard against bone loss or fractures, the following is recommended:  If  you are less than age 50, take 1000 mg of calcium and at least 600 mg of Vitamin D per day.  If you are greater than age 50 but less than age 70, take 1200 mg of calcium and at least 600 mg of Vitamin D per day.  If you are greater than age 70, take 1200 mg of calcium and at least 800 mg of Vitamin D per day. Smoking and excessive alcohol intake increases the risk of osteoporosis. Eat foods rich in calcium and vitamin D and do weight bearing exercises several times a week as your caregiver suggests. DIABETES Diabetes Melitus: If you have Type I or Type 2 diabetes, you should keep your blood sugar under control with diet, exercise and recommended medication. Avoid too many sweets, starchy and fatty foods. Being overweight can make control more difficult. COGNITION AND MEMORY Cognition and Memory: Menopausal hormone therapy is not recommended for the prevention of cognitive disorders such as Alzheimer's disease or memory loss.  DEPRESSION  Depression may occur at any age, but is common in elderly women. The reasons may be because of physical, medical, social (loneliness), or financial problems and needs. If you are experiencing depression because of medical problems and control of symptoms, talk to your caregiver about this. Physical activity and   exercise may help with mood and sleep. Community and volunteer involvement may help your sense of value and worth. If you have depression and you feel that the problem is getting worse or becoming severe, talk to your caregiver about treatment options that are best for you. ACCIDENTS  Accidents are common and can be serious in the elderly woman. Prepare your house to prevent accidents. Eliminate throw rugs, place hand bars in the bath, shower and toilet areas. Avoid wearing high heeled shoes or walking on wet, snowy, and icy areas. Limit or stop driving if you have vision or hearing problems, or you feel you are unsteady with you movements and  reflexes. HEPATITIS C Hepatitis C is a type of viral infection affecting the liver. It is spread mainly through contact with blood from an infected person. It can be treated, but if left untreated, it can lead to severe liver damage over years. Many people who are infected do not know that the virus is in their blood. If you are a "baby-boomer", it is recommended that you have one screening test for Hepatitis C. IMMUNIZATIONS  Several immunizations are important to consider having during your senior years, including:   Tetanus, diptheria, and pertussis booster shot.  Influenza every year before the flu season begins.  Pneumonia vaccine.  Shingles vaccine.  Others as indicated based on your specific needs. Talk to your caregiver about these. Document Released: 09/26/2005 Document Revised: 07/21/2012 Document Reviewed: 05/22/2008 Exit[employer] Patient Information 2014 [employer].  

## 2013-03-11 NOTE — Progress Notes (Signed)
Joan Marshall November 25, 1960 [id number]    History:    The patient presents for annual exam.  FSH was  105  10/2011. Mirena IUD was placed 04/2012 for pregnancy prevention.  Vivelle patch on occasion but did have bleeding when she used it, so stopped. Has had some health issues this past year with questionable [facility] spotted fever, saw Dr. Ninetta Lights. She is getting better at this time.  Mammogram 2012 showed some calcifications and is overdue. Normal Pap history. History of alcoholism, no alcohol for 24 years. History of depression, counseling and primary care manages.   Past medical history, past surgical history, family history and social history were all reviewed and documented in the EPIC chart. Barrister's clerk in middle school. Daughter is in the [place] in [place]. Son is at [employer]	[place] college getting his life in order, had had some drug abuse issues. Father died of melanoma at age 71, mother diabetes and hypertension. 2 paternal aunts breast cancer. Appendectomy 1983. 11 siblings several with alcohol abuse and depression issues.   ROS:  A  ROS was performed and pertinent positives and negatives are included in the history.  Exam:  Filed Vitals:   03/11/13 1557  BP: 112/68    General appearance:  Normal Head/Neck:  Normal, without cervical or supraclavicular adenopathy. Thyroid:  Symmetrical, normal in size, without palpable masses or nodularity. Respiratory  Effort:  Normal  Auscultation:  Clear without wheezing or rhonchi Cardiovascular  Auscultation:  Regular rate, without rubs, murmurs or gallops  Edema/varicosities:  Not grossly evident Abdominal  Soft,nontender, without masses, guarding or rebound.  Liver/spleen:  No organomegaly noted  Hernia:  None appreciated  Skin  Inspection:  Grossly normal  Palpation:  Grossly normal Neurologic/psychiatric  Orientation:  Normal with appropriate conversation.  Mood/affect:  Normal  Genitourinary    Breasts: Examined lying and  sitting.     Right: Without masses, retractions, discharge or axillary adenopathy.     Left: Without masses, retractions, discharge or axillary adenopathy.   Inguinal/mons:  Normal without inguinal adenopathy  External genitalia:  Normal  BUS/Urethra/Skene's glands:  Normal  Bladder:  Normal  Vagina:  Normal  Cervix:  Normal IUD strings visible  Uterus:  normal in size, shape and contour.  Midline and mobile  Adnexa/parametria:     Rt: Without masses or tenderness.   Lt: Without masses or tenderness.  Anus and perineum: Normal  Digital rectal exam: Normal sphincter tone without palpated masses or tenderness  Assessment/Plan:  52 y.o. DWF G3P2 for annual exam.    Anxiety/depression-counseling and psychiatrist-history of alcoholism Questionable [place] spotted fever - Dr. Ninetta Lights managing  Plan: Reviewed importance of scheduling a mammogram, history of calcifications, reports will schedule. SBE's, report changes, calcium rich diet, vitamin D 2000 daily encouraged. Options for menopausal reviewed, will watch at this time, if symptoms change or bleeding instructed to call. Mirena IUD removed intact shown to patient and discarded. Continue regular exercise, yoga and running, vitamin D 2000 daily encouraged. Labs at primary care reports as normal. Pap normal 2013, new screening guidelines reviewed. Instructed to schedule DEXA will schedule here.    Joan, Marshall WHNP, 5:00 PM 03/11/2013

## 2013-03-12 LAB — URINALYSIS W MICROSCOPIC + REFLEX CULTURE
Casts: NONE SEEN
Crystals: NONE SEEN
Leukocytes, UA: NEGATIVE
Nitrite: NEGATIVE
Specific Gravity, Urine: 1.01 (ref 1.005–1.030)
Squamous Epithelial / LPF: NONE SEEN
pH: 6.5 (ref 5.0–8.0)

## 2013-03-15 ENCOUNTER — Encounter: Payer: Self-pay | Admitting: Obstetrics and Gynecology

## 2013-03-21 ENCOUNTER — Other Ambulatory Visit: Payer: Self-pay | Admitting: Gynecology

## 2013-03-21 DIAGNOSIS — Z780 Asymptomatic menopausal state: Secondary | ICD-10-CM

## 2013-03-22 ENCOUNTER — Ambulatory Visit (INDEPENDENT_AMBULATORY_CARE_PROVIDER_SITE_OTHER): Payer: BC Managed Care – PPO

## 2013-03-22 DIAGNOSIS — Z780 Asymptomatic menopausal state: Secondary | ICD-10-CM

## 2013-03-22 DIAGNOSIS — M8580 Other specified disorders of bone density and structure, unspecified site: Secondary | ICD-10-CM

## 2013-03-22 DIAGNOSIS — M949 Disorder of cartilage, unspecified: Secondary | ICD-10-CM

## 2013-06-01 ENCOUNTER — Encounter: Payer: Self-pay | Admitting: Infectious Diseases

## 2013-06-01 ENCOUNTER — Ambulatory Visit (INDEPENDENT_AMBULATORY_CARE_PROVIDER_SITE_OTHER): Payer: BC Managed Care – PPO | Admitting: Infectious Diseases

## 2013-06-01 VITALS — BP 100/67 | HR 73 | Temp 98.0°F | Ht 67.0 in | Wt 124.0 lb

## 2013-06-01 DIAGNOSIS — R5381 Other malaise: Secondary | ICD-10-CM

## 2013-06-01 DIAGNOSIS — Z23 Encounter for immunization: Secondary | ICD-10-CM

## 2013-06-01 DIAGNOSIS — R5382 Chronic fatigue, unspecified: Secondary | ICD-10-CM | POA: Insufficient documentation

## 2013-06-01 DIAGNOSIS — Z113 Encounter for screening for infections with a predominantly sexual mode of transmission: Secondary | ICD-10-CM

## 2013-06-01 NOTE — Assessment & Plan Note (Signed)
She is concerned regarding her continued fatigue. I encouraged her that she actually feels better than she did previously. I encouraged her to continue to exert herself, improver her stamina. I suggested that the only etiologies that could still be plausible would be thyroid d/o, rheumatologic disease (SLE, RA, Ankylosing Spondylitis?). She defers w/u for these. She is due for repeat HIV testing. Will see her back prn.

## 2013-06-01 NOTE — Progress Notes (Signed)
  Subjective:    Patient ID: Joan Marshall, female    DOB: [birth year], 52 y.o.   MRN: [id number]  HPI 52 yo F with hx of night sweats, body/joint aches (mostly legs and lower back), since Sept 2013, repeat episode in November. Previously seen in ID clinic and only notable lab were eleavted EBV serologies, felt to have chronic fatigue post EBV.  Has been feeling "ok". Had summer off, resting more. 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 Is running, doing yoga, eating well, working, getting 9h sleep. Feels overwhelmed. Has seen her NP and her boyfriend both suggested she f/u.   Legs feel heavy, weighty.  Better than last year.  Sweats a lot, attributes to menopause.  Review of Systems  Constitutional: Negative for fever, chills and unexpected weight change.  Gastrointestinal: Negative for diarrhea and constipation.  Genitourinary: Negative for difficulty urinating.  Musculoskeletal: Positive for arthralgias. Negative for myalgias.  Skin: Negative for rash.  Hematological: Negative for adenopathy.  Psychiatric/Behavioral: Negative for dysphoric mood.  Has occas parietal headache (attributed to sinuses). Feels like a clamp on her head. Relieved with rest. Does not even take alleve.     Objective:   Physical Exam  Constitutional: She appears well-developed and well-nourished.  HENT:  Mouth/Throat: No oropharyngeal exudate.  Eyes: EOM are normal. Pupils are equal, round, and reactive to light.  Neck: Neck supple. No thyromegaly present.  Cardiovascular: Normal rate, regular rhythm and normal heart sounds.   Pulmonary/Chest: Effort normal and breath sounds normal.  Abdominal: Soft. Bowel sounds are normal. She exhibits no distension. There is no tenderness.  Musculoskeletal: She exhibits no edema.  Lymphadenopathy:    She has no cervical adenopathy.  Skin: Skin is warm and dry. No rash noted. No erythema.  Psychiatric: She has a normal mood and affect. Her behavior is normal.           Assessment & Plan:

## 2013-06-01 NOTE — Addendum Note (Signed)
Addended by: Frandy Basnett C on: 06/01/2013 10:57 AM   Modules accepted: Orders

## 2013-06-01 NOTE — Addendum Note (Signed)
Addended by: Mariea Clonts D on: 06/01/2013 11:12 AM   Modules accepted: Orders

## 2013-06-02 LAB — HIV-1 RNA QUANT-NO REFLEX-BLD: HIV 1 RNA Quant: <20 copies/mL (ref <20)

## 2013-11-03 ENCOUNTER — Other Ambulatory Visit: Payer: Self-pay | Admitting: Women's Health

## 2013-11-03 DIAGNOSIS — R921 Mammographic calcification found on diagnostic imaging of breast: Secondary | ICD-10-CM

## 2013-11-14 ENCOUNTER — Ambulatory Visit: Payer: BC Managed Care – PPO | Admitting: Infectious Diseases

## 2013-11-22 ENCOUNTER — Encounter: Payer: Self-pay | Admitting: Women's Health

## 2013-11-22 ENCOUNTER — Ambulatory Visit
Admission: RE | Admit: 2013-11-22 | Discharge: 2013-11-22 | Disposition: A | Discharge status: Home / Self Care | Payer: Self-pay | Source: Ambulatory Visit | Attending: Women's Health | Admitting: Women's Health

## 2013-11-22 DIAGNOSIS — R921 Mammographic calcification found on diagnostic imaging of breast: Secondary | ICD-10-CM

## 2014-06-02 ENCOUNTER — Other Ambulatory Visit: Payer: Self-pay

## 2014-06-19 ENCOUNTER — Encounter: Payer: Self-pay | Admitting: Infectious Diseases

## 2014-10-17 ENCOUNTER — Other Ambulatory Visit: Payer: Self-pay | Admitting: Dermatology

## 2015-03-07 ENCOUNTER — Ambulatory Visit
Admit: 2015-03-07 | Discharge: 2015-03-07 | Disposition: A | Discharge status: Home / Self Care | Payer: Self-pay | Attending: Emergency Medicine | Admitting: Emergency Medicine

## 2015-03-07 ENCOUNTER — Encounter: Payer: Self-pay | Admitting: Nurse Practitioner

## 2015-03-07 DIAGNOSIS — S0502XA Injury of conjunctiva and corneal abrasion without foreign body, left eye, initial encounter: Secondary | ICD-10-CM

## 2015-03-07 LAB — HM HIV SCREENING OFFERED

## 2015-03-07 MED ORDER — MOXIFLOXACIN HCL 0.5 % OP SOLN *I*
1.0000 [drp] | Freq: Three times a day (TID) | OPHTHALMIC | 0 refills | Status: DC
Start: 2015-03-07 — End: 2015-08-19

## 2015-03-07 NOTE — UC Provider Note (Signed)
History     Chief Complaint   Patient presents with    Eye Injury     Pt. states she has a scrath on her cornea and states the pain has been increasing   54 y.o. Female, believes she scratched her left cornea putting contact lenses in. 1 day of mod const worsening left eye irrit with assoc redness, increased tearing. Worse this morning. No relief with cool compresses.    History provided by:  Patient  Language interpreter used: No    Is this ED visit related to civilian activity for income:  Not work related      History reviewed. No pertinent past medical history.         History reviewed. No pertinent past surgical history.    History reviewed. No pertinent family history.      Social History      reports that she has never smoked. She does not have any smokeless tobacco history on file. Her alcohol, drug, and sexual activity histories are not on file.    Living Situation     Questions Responses    Patient lives with [facility]     Caregiver for other family member     External Services None    Employment     Domestic Violence Risk           Review of Systems   Review of Systems   Constitutional: Negative.    Eyes: Positive for pain, discharge and redness.   Respiratory: Negative.    Cardiovascular: Negative.    Musculoskeletal: Negative.    Neurological: Negative.        Physical Exam     ED Triage Vitals   BP Heart Rate Heart Rate (via Pulse Ox) Resp Temp Temp src SpO2 O2 Device O2 Flow Rate   03/07/15 1209 03/07/15 1209 -- 03/07/15 1209 03/07/15 1209 03/07/15 1209 03/07/15 1209 03/07/15 1209 --   107/69 87  18 36.3 C (97.3 F) TEMPORAL 99 % None (Room air)       Weight           --                          Physical Exam   Constitutional: She is oriented to person, place, and time. She appears well-developed and well-nourished.   HENT:   Head: Normocephalic and atraumatic.   Right Ear: External ear normal.   Left Ear: External ear normal.   Nose: Nose normal.   Mouth/Throat: Oropharynx is clear and  moist.   Eyes: EOM are normal. Pupils are equal, round, and reactive to light. Left conjunctiva is injected.       Neck: Normal range of motion. Neck supple.   Cardiovascular: Normal rate, regular rhythm, normal heart sounds and intact distal pulses.    Pulmonary/Chest: Effort normal and breath sounds normal.   Musculoskeletal: Normal range of motion.   Neurological: She is alert and oriented to person, place, and time. No cranial nerve deficit.   Skin: Skin is warm and dry.   Psychiatric: She has a normal mood and affect. Her behavior is normal. Judgment and thought content normal.   Nursing note and vitals reviewed.       Medical Decision Making        Initial Evaluation:  ED First Provider Contact     Date/Time Event User Comments    03/07/15 1236 ED Provider First Contact  Melven Stockard H Initial Face to Face Provider Contact          Patient seen by me today 03/07/2015 at at time of arrival  11:59 AM.  Initial face to face evaluation time noted above may be discrepant due to patient acuity and delay in documentation.    Assessment:  54 y.o., female comes to the Urgent Care Center with Left eye irrit, redness    Differential Diagnosis includes Conjunctivitis, Corneal abrasion             Plan: Vigamox drops, Ophtho f/u if symptoms worsen.    Final Diagnosis  Final diagnoses:   [S05.02XA] Left corneal abrasion, initial encounter (Primary)           Orpah CobbMichael Hannon Shelbe Haglund, MD       Orpah CobbLoeb, Keilany Burnette Hannon, MD  03/07/15 1240

## 2015-08-19 ENCOUNTER — Ambulatory Visit
Admission: AD | Admit: 2015-08-19 | Discharge: 2015-08-19 | Disposition: A | Discharge status: Home / Self Care | Payer: Self-pay | Attending: Emergency Medicine | Admitting: Emergency Medicine

## 2015-08-19 DIAGNOSIS — S0501XA Injury of conjunctiva and corneal abrasion without foreign body, right eye, initial encounter: Secondary | ICD-10-CM

## 2015-08-19 MED ORDER — SULFACETAMIDE SODIUM 10 % OP SOLN *I*
2.0000 [drp] | Freq: Four times a day (QID) | OPHTHALMIC | 0 refills | Status: AC
Start: 2015-08-19 — End: 2015-08-26

## 2015-08-19 MED ORDER — INFLUENZA VAC SPLIT QUAD IM SUSP *I*
0.5000 mL | Freq: Once | INTRAMUSCULAR | Status: AC
Start: 2015-08-19 — End: 2015-08-19
  Administered 2015-08-19: 0.5 mL via INTRAMUSCULAR

## 2015-08-19 NOTE — Discharge Instructions (Signed)
Wear sunglasses until pain with light exposure resolves.

## 2015-08-19 NOTE — ED Triage Notes (Signed)
pt with right eye redness scratched cornea last pm after wearing contacts        Triage Note   Gavin PoundElizabeth M Kennedy-Gebo, RN

## 2015-08-21 ENCOUNTER — Encounter: Payer: Self-pay | Admitting: Emergency Medicine

## 2015-08-21 NOTE — UC Provider Note (Signed)
History     Chief Complaint   Patient presents with    Eye Problem     pt with right eye redness scratched cornea last pm after wearing contacts      HPI Comments: Has had corneal abrasions before and this feels the same. Thinks she got an ash from the woodfire at her brother's house under her daily wear contact lens last night. This am has sensation of FB but finds nothing and has no relief from irrigation by saline eye wash. Has mild photophobia and eye watering, but vision is normal. No deep eye pain or systemic sxs.       History provided by:  Patient  Is this ED visit related to civilian activity for income:  Not work related      History reviewed. No pertinent past medical history.         History reviewed. No pertinent past surgical history.    History reviewed. No pertinent family history.      Social History    reports that she has never smoked. She does not have any smokeless tobacco history on file. She reports that she drinks alcohol. Her drug and sexual activity histories are not on file.    Living Situation     Questions Responses    Patient lives with Family    Homeless No    Caregiver for other family member No    External Services None    Employment Employed    Domestic Violence Risk No          Review of Systems   Review of Systems   Constitutional: Negative for activity change, chills, fatigue and fever.   HENT: Negative for congestion, rhinorrhea and sore throat.    Eyes: Positive for photophobia. Negative for pain, redness, itching and visual disturbance.   Respiratory: Negative for cough and shortness of breath.    Cardiovascular: Negative for chest pain.   Gastrointestinal: Negative for nausea and vomiting.   Skin: Negative for rash.   Neurological: Negative for dizziness and headaches.   Psychiatric/Behavioral: Negative for confusion and sleep disturbance.       Physical Exam     ED Triage Vitals   BP Heart Rate Heart Rate (via Pulse Ox) Resp Temp Temp src SpO2 O2 Device O2 Flow Rate    08/19/15 1345 08/19/15 1345 -- 08/19/15 1345 08/19/15 1345 -- 08/19/15 1345 -- --   118/75 72  18 36.7 C (98 F)  100 %        Weight           08/19/15 1345           56.2 kg (124 lb)               Physical Exam   Constitutional: She is oriented to person, place, and time. She appears well-developed. No distress.   HENT:   Head: Normocephalic and atraumatic.   Eyes: Conjunctivae and EOM are normal. Pupils are equal, round, and reactive to light. Right eye exhibits no discharge. Left eye exhibits no discharge. No scleral icterus.   Photophobia on exam: NO. Fluorescein exam reveals very small lateral peripheral superficial abrasion.   Neck: Neck supple.   Cardiovascular: Normal rate and intact distal pulses.    Pulmonary/Chest: Effort normal.   Musculoskeletal: She exhibits no edema.   Gait normal.   Neurological: She is alert and oriented to person, place, and time. No cranial nerve deficit (face symmetric, speech clear).  Skin: Skin is warm and dry. No rash noted.   Psychiatric: She has a normal mood and affect. Her behavior is normal.   Nursing note and vitals reviewed.       Medical Decision Making        Initial Evaluation:  ED First Provider Contact     Date/Time Event User Comments    08/19/15 1417 ED Provider First Contact Dorna Bloom Initial Face to Face Provider Contact          Patient seen by me as above    Assessment:  55 y.o.female comes to the Urgent Care Center with R corneal abrasion    Differential Diagnosis includes current FB, corneal ulcer, ophthalmic HSV or shingles, conjunctivitis              Plan: Fluorescein exam confirms her suspicion. Antibiotic prophylaxis, infection control and comfort measures reviewed.    Final Diagnosis  Final diagnoses:   [S05.01XA] Corneal abrasion, right, initial encounter (Primary)     Orders Placed This Encounter    Influenza Vac Split Quad (FLULAVAL QUADRIVALENT) injection-vial 0.5 mL    sulfacetamide (BLEPH-10) 10 % ophthalmic solution       Final  Diagnosis    ICD-10-CM ICD-9-CM   1. Corneal abrasion, right, initial encounter S05.01XA 918.1       Rest, hydration, good hand hygiene and infection control reviewed.  Symptom relief and warning signs reviewed.    Use over the counter medications as discussed.    Please start the new medications as below:    Current Discharge Medication List      New Medications    Details Last Dose Given Next Dose Due Script Given?   sulfacetamide (BLEPH-10) 2 drops Dose: 2 drops  Place 2 drops into the right eye 4 times daily    Quantity 15 mL, Refill 0  Start date: 08/19/2015, End date: 08/26/2015                   Please follow up with your physician as below:    Follow-up Information     Follow up with Provider, None.    Why:  in [place], As needed    Contact information:    [address]  [address]  [place] [address]          Please follow up.    Why:  Your eye doctor at home re recurrent abrasions        If short of breath, chest pains or any other concerns please report to the emergency room.    In the event of an Emergency dial 911.        Dorna Bloom, MD       Dorna Bloom, MD  08/21/15 2012

## 2015-09-12 ENCOUNTER — Other Ambulatory Visit (INDEPENDENT_AMBULATORY_CARE_PROVIDER_SITE_OTHER): Payer: Self-pay | Admitting: Otolaryngology

## 2015-09-12 DIAGNOSIS — R221 Localized swelling, mass and lump, neck: Secondary | ICD-10-CM

## 2016-05-14 ENCOUNTER — Emergency Department (HOSPITAL_COMMUNITY)
Admission: EM | Admit: 2016-05-14 | Discharge: 2016-05-14 | Disposition: A | Discharge status: Home / Self Care | Payer: BC Managed Care – PPO | Attending: Physician Assistant | Admitting: Physician Assistant

## 2016-05-14 ENCOUNTER — Encounter (HOSPITAL_COMMUNITY): Payer: Self-pay | Admitting: Emergency Medicine

## 2016-05-14 ENCOUNTER — Emergency Department (HOSPITAL_COMMUNITY): Payer: BC Managed Care – PPO

## 2016-05-14 DIAGNOSIS — R0789 Other chest pain: Secondary | ICD-10-CM | POA: Diagnosis not present

## 2016-05-14 DIAGNOSIS — Z87891 Personal history of nicotine dependence: Secondary | ICD-10-CM | POA: Diagnosis not present

## 2016-05-14 DIAGNOSIS — R079 Chest pain, unspecified: Secondary | ICD-10-CM

## 2016-05-14 HISTORY — DX: Anxiety disorder, unspecified: F41.9

## 2016-05-14 LAB — CBC
HCT: 39.1 % (ref 36.0–46.0)
Hemoglobin: 12.1 g/dL (ref 12.0–15.0)
MCH: 29.9 pg (ref 26.0–34.0)
MCHC: 30.9 g/dL (ref 30.0–36.0)
MCV: 96.5 fL (ref 78.0–100.0)
PLATELETS: 185 10*3/uL (ref 150–400)
RBC: 4.05 MIL/uL (ref 3.87–5.11)
RDW: 12.5 % (ref 11.5–15.5)
WBC: 6 10*3/uL (ref 4.0–10.5)

## 2016-05-14 LAB — BASIC METABOLIC PANEL
Anion gap: 8 (ref 5–15)
BUN: 18 mg/dL (ref 6–20)
CALCIUM: 9.8 mg/dL (ref 8.9–10.3)
CO2: 29 mmol/L (ref 22–32)
CREATININE: 1 mg/dL (ref 0.44–1.00)
Chloride: 102 mmol/L (ref 101–111)
GFR calc non Af Amer: >60 mL/min (ref >60)
GLUCOSE: 88 mg/dL (ref 65–99)
Potassium: 4.5 mmol/L (ref 3.5–5.1)
Sodium: 139 mmol/L (ref 135–145)

## 2016-05-14 LAB — I-STAT TROPONIN, ED
TROPONIN I, POC: 0 ng/mL (ref 0.00–0.08)
Troponin i, poc: 0 ng/mL (ref 0.00–0.08)
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
## 2016-05-14 NOTE — ED Triage Notes (Signed)
EMS gave 324 mg asa and 1 nitro.

## 2016-05-14 NOTE — Discharge Instructions (Signed)
We think there is a likely chance that this is not your heart. However we can never be 100% sure. We will need a please follow-up with your primary care physician and cardiologist as needed.
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
## 2016-12-18 ENCOUNTER — Ambulatory Visit (INDEPENDENT_AMBULATORY_CARE_PROVIDER_SITE_OTHER): Payer: BC Managed Care – PPO | Admitting: Psychology

## 2016-12-18 DIAGNOSIS — F4310 Post-traumatic stress disorder, unspecified: Secondary | ICD-10-CM

## 2016-12-23 ENCOUNTER — Ambulatory Visit (INDEPENDENT_AMBULATORY_CARE_PROVIDER_SITE_OTHER): Payer: BC Managed Care – PPO | Admitting: Psychology

## 2016-12-23 DIAGNOSIS — F4310 Post-traumatic stress disorder, unspecified: Secondary | ICD-10-CM

## 2016-12-30 ENCOUNTER — Encounter: Payer: Self-pay | Admitting: Women's Health

## 2016-12-30 ENCOUNTER — Ambulatory Visit (INDEPENDENT_AMBULATORY_CARE_PROVIDER_SITE_OTHER): Payer: BC Managed Care – PPO | Admitting: Women's Health

## 2016-12-30 ENCOUNTER — Other Ambulatory Visit: Payer: Self-pay | Admitting: Women's Health

## 2016-12-30 VITALS — BP 122/80 | Ht 67.0 in | Wt 133.0 lb

## 2016-12-30 DIAGNOSIS — R300 Dysuria: Secondary | ICD-10-CM

## 2016-12-30 DIAGNOSIS — N3001 Acute cystitis with hematuria: Secondary | ICD-10-CM

## 2016-12-30 DIAGNOSIS — Z113 Encounter for screening for infections with a predominantly sexual mode of transmission: Secondary | ICD-10-CM | POA: Diagnosis not present

## 2016-12-30 LAB — URINALYSIS W MICROSCOPIC + REFLEX CULTURE
Bilirubin Urine: NEGATIVE
Casts: NONE SEEN [LPF]
Crystals: NONE SEEN [HPF]
GLUCOSE, UA: NEGATIVE
Nitrite: NEGATIVE
PH: 6 (ref 5.0–8.0)
Specific Gravity, Urine: 1.025 (ref 1.001–1.035)
Yeast: NONE SEEN [HPF]

## 2016-12-30 MED ORDER — SULFAMETHOXAZOLE-TRIMETHOPRIM 800-160 MG PO TABS: 1.0000 | ORAL_TABLET | Freq: Two times a day (BID) | ORAL | 0 refills | Status: DC

## 2016-12-30 NOTE — Patient Instructions (Signed)
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
## 2016-12-31 ENCOUNTER — Encounter: Payer: Self-pay | Admitting: Women's Health

## 2016-12-31 ENCOUNTER — Encounter: Payer: Self-pay | Admitting: Gynecology

## 2016-12-31 LAB — GC/CHLAMYDIA PROBE AMP
CT PROBE, AMP APTIMA: NOT DETECTED
GC PROBE AMP APTIMA: NOT DETECTED

## 2017-01-01 ENCOUNTER — Encounter: Payer: Self-pay | Admitting: Women's Health

## 2017-01-01 LAB — URINE CULTURE

## 2017-01-07 ENCOUNTER — Ambulatory Visit (INDEPENDENT_AMBULATORY_CARE_PROVIDER_SITE_OTHER): Payer: BC Managed Care – PPO | Admitting: Women's Health

## 2017-01-07 ENCOUNTER — Encounter: Payer: Self-pay | Admitting: Women's Health

## 2017-01-07 ENCOUNTER — Ambulatory Visit (INDEPENDENT_AMBULATORY_CARE_PROVIDER_SITE_OTHER): Payer: BC Managed Care – PPO | Admitting: Psychology

## 2017-01-07 VITALS — BP 118/78 | Ht 67.0 in | Wt 133.0 lb

## 2017-01-07 DIAGNOSIS — Z01419 Encounter for gynecological examination (general) (routine) without abnormal findings: Secondary | ICD-10-CM | POA: Diagnosis not present

## 2017-01-07 DIAGNOSIS — Z13220 Encounter for screening for lipoid disorders: Secondary | ICD-10-CM | POA: Diagnosis not present

## 2017-01-07 DIAGNOSIS — F4310 Post-traumatic stress disorder, unspecified: Secondary | ICD-10-CM

## 2017-01-07 DIAGNOSIS — Z113 Encounter for screening for infections with a predominantly sexual mode of transmission: Secondary | ICD-10-CM | POA: Diagnosis not present

## 2017-01-07 DIAGNOSIS — R350 Frequency of micturition: Secondary | ICD-10-CM

## 2017-01-07 DIAGNOSIS — N898 Other specified noninflammatory disorders of vagina: Secondary | ICD-10-CM

## 2017-01-07 LAB — LIPID PANEL
CHOLESTEROL: 253 mg/dL — AB (ref <200)
HDL: 63 mg/dL (ref >50)
LDL CALC: 153 mg/dL — AB (ref <100)
TRIGLYCERIDES: 184 mg/dL — AB (ref <150)
Total CHOL/HDL Ratio: 4 Ratio (ref <5.0)
VLDL: 37 mg/dL — AB (ref <30)

## 2017-01-07 LAB — URINALYSIS W MICROSCOPIC + REFLEX CULTURE
Bacteria, UA: NONE SEEN [HPF]
Bilirubin Urine: NEGATIVE
CASTS: NONE SEEN [LPF]
CRYSTALS: NONE SEEN [HPF]
Glucose, UA: NEGATIVE
KETONES UR: NEGATIVE
Leukocytes, UA: NEGATIVE
Nitrite: NEGATIVE
Protein, ur: NEGATIVE
RBC / HPF: NONE SEEN RBC/HPF (ref <=2)
WBC, UA: NONE SEEN WBC/HPF (ref <=5)
Yeast: NONE SEEN [HPF]
pH: 6 (ref 5.0–8.0)

## 2017-01-07 LAB — WET PREP FOR TRICH, YEAST, CLUE
Clue Cells Wet Prep HPF POC: NONE SEEN
TRICH WET PREP: NONE SEEN
YEAST WET PREP: NONE SEEN

## 2017-01-07 NOTE — Addendum Note (Signed)
Addended by: Joan ParkinsonBARNES, Joan Marshall on: 01/07/2017 03:15 PM   Modules accepted: Orders

## 2017-01-07 NOTE — Progress Notes (Signed)
Joan Marshall Apr 25, 1961 [id number]    History:    Presents for annual exam.  Postmenopausal on no HRT with no bleeding. Was treated for UTI 2 weeks ago and has felt better since treatment but continues to have slight bladder pressure. Normal Pap history, last Pap 2013. Overdue for mammogram as well. History of anxiety and depression, alcohol abuse is in counseling and does see a psychiatrist. Currently on disability and planning to return to school/work in September.  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  ROS:  A ROS was performed and pertinent positives and negatives are included.  Exam:  Vitals:   01/07/17 1211  BP: 118/78  Weight: 133 lb (60.3 kg)  Height: 5\' 7"  (1.702 m)   Body mass index is 20.83 kg/m.   General appearance:  Normal Thyroid:  Symmetrical, normal in size, without palpable masses or nodularity. Respiratory  Auscultation:  Clear without wheezing or rhonchi Cardiovascular  Auscultation:  Regular rate, without rubs, murmurs or gallops  Edema/varicosities:  Not grossly evident Abdominal  Soft,nontender, without masses, guarding or rebound.  Liver/spleen:  No organomegaly noted  Hernia:  None appreciated  Skin  Inspection:  Grossly normal   Breasts: Examined lying and sitting.     Right: Without masses, retractions, discharge or axillary adenopathy.     Left: Without masses, retractions, discharge or axillary adenopathy. Gentitourinary   Inguinal/mons:  Normal without inguinal adenopathy  External genitalia:  Normal  BUS/Urethra/Skene's glands:  Normal  Vagina:  Normal Wet prep negative  Cervix:  Normal  Uterus:   normal in size, shape and contour.  Midline and mobile  Adnexa/parametria:     Rt: Without masses or tenderness.   Lt: Without masses or tenderness.  Anus and perineum: Normal  Digital rectal exam: Normal sphincter tone without  palpated masses or tenderness  UA: Negative   Assessment/Plan:  56 y.o. DW F G2 P2 for annual exam with complaint of questionable vaginal odor.  Postmenopausal/no HRT/no bleeding STD screen Anxiety/depression -psychiatrist and counseling manage  Plan: Reviewed wet prep and exam negative/normal. SBE's, reviewed importance of annual screening mammogram breast center information given instructed to schedule. Continue active lifestyle of regular exercise, home safety, fall prevention and weightbearing exercise reviewed. Screening colonoscopy encouraged has appointment scheduled with Dr. Kinnie ScalesMedoff. Continue counseling, encouraged leisure activities and regular exercise. Reviewed GC/Chlamydia culture negative will check lipid panel, HIV, hep B, C, RPR. Pap with HR HPV typing, new screening guidelines reviewed.  Harrington Challengerancy J Lindsea Olivar [facility]lingtonWHNP, 12:51 PM 01/07/2017

## 2017-01-07 NOTE — Patient Instructions (Signed)
Mammogram  [phone]  Health Maintenance for Postmenopausal Women Menopause is a normal process in which your reproductive ability comes to an end. This process happens gradually over a span of months to years, usually between the ages of 48 and 96. Menopause is complete when you have missed 12 consecutive menstrual periods. It is important to talk with your health care provider about some of the most common conditions that affect postmenopausal women, such as heart disease, cancer, and bone loss (osteoporosis). Adopting a healthy lifestyle and getting preventive care can help to promote your health and wellness. Those actions can also lower your chances of developing some of these common conditions. What should I know about menopause? During menopause, you may experience a number of symptoms, such as:  Moderate-to-severe hot flashes.  Night sweats.  Decrease in sex drive.  Mood swings.  Headaches.  Tiredness.  Irritability.  Memory problems.  Insomnia. Choosing to treat or not to treat menopausal changes is an individual decision that you make with your health care provider. What should I know about hormone replacement therapy and supplements? Hormone therapy products are effective for treating symptoms that are associated with menopause, such as hot flashes and night sweats. Hormone replacement carries certain risks, especially as you become older. If you are thinking about using estrogen or estrogen with progestin treatments, discuss the benefits and risks with your health care provider. What should I know about heart disease and stroke? Heart disease, heart attack, and stroke become more likely as you age. This may be due, in part, to the hormonal changes that your body experiences during menopause. These can affect how your body processes dietary fats, triglycerides, and cholesterol. Heart attack and stroke are both medical emergencies. There are many things that you can do to help  prevent heart disease and stroke:  Have your blood pressure checked at least every 1-2 years. High blood pressure causes heart disease and increases the risk of stroke.  If you are 70-6 years old, ask your health care provider if you should take aspirin to prevent a heart attack or a stroke.  Do not use any tobacco products, including cigarettes, chewing tobacco, or electronic cigarettes. If you need help quitting, ask your health care provider.  It is important to eat a healthy diet and maintain a healthy weight.  Be sure to include plenty of vegetables, fruits, low-fat dairy products, and lean protein.  Avoid eating foods that are high in solid fats, added sugars, or salt (sodium).  Get regular exercise. This is one of the most important things that you can do for your health.  Try to exercise for at least 150 minutes each week. The type of exercise that you do should increase your heart rate and make you sweat. This is known as moderate-intensity exercise.  Try to do strengthening exercises at least twice each week. Do these in addition to the moderate-intensity exercise.  Know your numbers.Ask your health care provider to check your cholesterol and your blood glucose. Continue to have your blood tested as directed by your health care provider. What should I know about cancer screening? There are several types of cancer. Take the following steps to reduce your risk and to catch any cancer development as early as possible. Breast Cancer  Practice breast self-awareness.  This means understanding how your breasts normally appear and feel.  It also means doing regular breast self-exams. Let your health care provider know about any changes, no matter how small.  If you  are 50 or older, have a clinician do a breast exam (clinical breast exam or CBE) every year. Depending on your age, family history, and medical history, it may be recommended that you also have a yearly breast X-ray  (mammogram).  If you have a family history of breast cancer, talk with your health care provider about genetic screening.  If you are at high risk for breast cancer, talk with your health care provider about having an MRI and a mammogram every year.  Breast cancer (BRCA) gene test is recommended for women who have family members with BRCA-related cancers. Results of the assessment will determine the need for genetic counseling and BRCA1 and for BRCA2 testing. BRCA-related cancers include these types:  Breast. This occurs in males or females.  Ovarian.  Tubal. This may also be called fallopian tube cancer.  Cancer of the abdominal or pelvic lining (peritoneal cancer).  Prostate.  Pancreatic. Cervical, Uterine, and Ovarian Cancer  Your health care provider may recommend that you be screened regularly for cancer of the pelvic organs. These include your ovaries, uterus, and vagina. This screening involves a pelvic exam, which includes checking for microscopic changes to the surface of your cervix (Pap test).  For women ages 21-65, health care providers may recommend a pelvic exam and a Pap test every three years. For women ages 30-65, they may recommend the Pap test and pelvic exam, combined with testing for human papilloma virus (HPV), every five years. Some types of HPV increase your risk of cervical cancer. Testing for HPV may also be done on women of any age who have unclear Pap test results.  Other health care providers may not recommend any screening for nonpregnant women who are considered low risk for pelvic cancer and have no symptoms. Ask your health care provider if a screening pelvic exam is right for you.  If you have had past treatment for cervical cancer or a condition that could lead to cancer, you need Pap tests and screening for cancer for at least 20 years after your treatment. If Pap tests have been discontinued for you, your risk factors (such as having a new sexual  partner) need to be reassessed to determine if you should start having screenings again. Some women have medical problems that increase the chance of getting cervical cancer. In these cases, your health care provider may recommend that you have screening and Pap tests more often.  If you have a family history of uterine cancer or ovarian cancer, talk with your health care provider about genetic screening.  If you have vaginal bleeding after reaching menopause, tell your health care provider.  There are currently no reliable tests available to screen for ovarian cancer. Lung Cancer  Lung cancer screening is recommended for adults 46-75 years old who are at high risk for lung cancer because of a history of smoking. A yearly low-dose CT scan of the lungs is recommended if you:  Currently smoke.  Have a history of at least 30 pack-years of smoking and you currently smoke or have quit within the past 15 years. A pack-year is smoking an average of one pack of cigarettes per day for one year. Yearly screening should:  Continue until it has been 15 years since you quit.  Stop if you develop a health problem that would prevent you from having lung cancer treatment. Colorectal Cancer  This type of cancer can be detected and can often be prevented.  Routine colorectal cancer screening usually begins at  age 34 and continues through age 65.  If you have risk factors for colon cancer, your health care provider may recommend that you be screened at an earlier age.  If you have a family history of colorectal cancer, talk with your health care provider about genetic screening.  Your health care provider may also recommend using home test kits to check for hidden blood in your stool.  A small camera at the end of a tube can be used to examine your colon directly (sigmoidoscopy or colonoscopy). This is done to check for the earliest forms of colorectal cancer.  Direct examination of the colon should be  repeated every 5-10 years until age 49. However, if early forms of precancerous polyps or small growths are found or if you have a family history or genetic risk for colorectal cancer, you may need to be screened more often. Skin Cancer  Check your skin from head to toe regularly.  Monitor any moles. Be sure to tell your health care provider:  About any new moles or changes in moles, especially if there is a change in a mole's shape or color.  If you have a mole that is larger than the size of a pencil eraser.  If any of your family members has a history of skin cancer, especially at a young age, talk with your health care provider about genetic screening.  Always use sunscreen. Apply sunscreen liberally and repeatedly throughout the day.  Whenever you are outside, protect yourself by wearing long sleeves, pants, a wide-brimmed hat, and sunglasses. What should I know about osteoporosis? Osteoporosis is a condition in which bone destruction happens more quickly than new bone creation. After menopause, you may be at an increased risk for osteoporosis. To help prevent osteoporosis or the bone fractures that can happen because of osteoporosis, the following is recommended:  If you are 14-75 years old, get at least 1,000 mg of calcium and at least 600 mg of vitamin D per day.  If you are older than age 4 but younger than age 31, get at least 1,200 mg of calcium and at least 600 mg of vitamin D per day.  If you are older than age 32, get at least 1,200 mg of calcium and at least 800 mg of vitamin D per day. Smoking and excessive alcohol intake increase the risk of osteoporosis. Eat foods that are rich in calcium and vitamin D, and do weight-bearing exercises several times each week as directed by your health care provider. What should I know about how menopause affects my mental health? Depression may occur at any age, but it is more common as you become older. Common symptoms of depression  include:  Low or sad mood.  Changes in sleep patterns.  Changes in appetite or eating patterns.  Feeling an overall lack of motivation or enjoyment of activities that you previously enjoyed.  Frequent crying spells. Talk with your health care provider if you think that you are experiencing depression. What should I know about immunizations? It is important that you get and maintain your immunizations. These include:  Tetanus, diphtheria, and pertussis (Tdap) booster vaccine.  Influenza every year before the flu season begins.  Pneumonia vaccine.  Shingles vaccine. Your health care provider may also recommend other immunizations. This information is not intended to replace advice given to you by your health care provider. Make sure you discuss any questions you have with your health care provider. Document Released: 09/26/2005 Document Revised: 02/22/2016 Document Reviewed:  05/08/2015 Elsevier Interactive Patient Education  2017 [employer].

## 2017-01-08 LAB — HEPATITIS B SURFACE ANTIGEN: Hepatitis B Surface Ag: NEGATIVE

## 2017-01-08 LAB — HEPATITIS C ANTIBODY: HCV AB: NEGATIVE

## 2017-01-08 LAB — HIV ANTIBODY (ROUTINE TESTING W REFLEX): HIV: NONREACTIVE

## 2017-01-08 LAB — RPR

## 2017-01-13 LAB — PAP, TP IMAGING W/ HPV RNA, RFLX HPV TYPE 16,18/45: HPV mRNA, High Risk: NOT DETECTED

## 2017-01-27 ENCOUNTER — Telehealth: Payer: Self-pay

## 2017-01-27 NOTE — Telephone Encounter (Signed)
I called patient because My Chart email message was returned unread. Patient said she had viewed the results in the My Chart and had already been to see a nutritionist. I advised her [place] wrote ""Lipid panel is elevated, continue exercise, <20 gm sat fat daily, fish oil supplement daily. May want to follow up with primary care for possible cholesterol medication. "  She said she really wants to work on diet and exercise and try not to have to take medication.

## 2017-02-25 ENCOUNTER — Ambulatory Visit: Payer: BC Managed Care – PPO | Admitting: Psychology

## 2017-03-04 ENCOUNTER — Ambulatory Visit: Payer: BC Managed Care – PPO | Admitting: Psychology

## 2017-03-11 ENCOUNTER — Ambulatory Visit (INDEPENDENT_AMBULATORY_CARE_PROVIDER_SITE_OTHER): Payer: BC Managed Care – PPO | Admitting: Psychology

## 2017-03-11 DIAGNOSIS — F4310 Post-traumatic stress disorder, unspecified: Secondary | ICD-10-CM

## 2017-04-01 ENCOUNTER — Ambulatory Visit (INDEPENDENT_AMBULATORY_CARE_PROVIDER_SITE_OTHER): Payer: BC Managed Care – PPO | Admitting: Psychology

## 2017-04-01 DIAGNOSIS — F4310 Post-traumatic stress disorder, unspecified: Secondary | ICD-10-CM | POA: Diagnosis not present

## 2017-04-08 ENCOUNTER — Ambulatory Visit (INDEPENDENT_AMBULATORY_CARE_PROVIDER_SITE_OTHER): Payer: BC Managed Care – PPO | Admitting: Psychology

## 2017-04-08 DIAGNOSIS — F4310 Post-traumatic stress disorder, unspecified: Secondary | ICD-10-CM | POA: Diagnosis not present

## 2017-04-15 ENCOUNTER — Ambulatory Visit: Payer: BC Managed Care – PPO | Admitting: Psychology

## 2017-05-01 ENCOUNTER — Ambulatory Visit: Payer: BC Managed Care – PPO | Admitting: Psychology

## 2017-05-15 ENCOUNTER — Ambulatory Visit (INDEPENDENT_AMBULATORY_CARE_PROVIDER_SITE_OTHER): Payer: BC Managed Care – PPO | Admitting: Psychology

## 2017-05-15 DIAGNOSIS — F4310 Post-traumatic stress disorder, unspecified: Secondary | ICD-10-CM | POA: Diagnosis not present

## 2017-05-27 ENCOUNTER — Ambulatory Visit: Payer: Self-pay | Admitting: Psychology

## 2017-05-29 ENCOUNTER — Ambulatory Visit (INDEPENDENT_AMBULATORY_CARE_PROVIDER_SITE_OTHER): Payer: BC Managed Care – PPO | Admitting: Psychology

## 2017-05-29 DIAGNOSIS — F4310 Post-traumatic stress disorder, unspecified: Secondary | ICD-10-CM

## 2017-06-12 ENCOUNTER — Ambulatory Visit (INDEPENDENT_AMBULATORY_CARE_PROVIDER_SITE_OTHER): Payer: BC Managed Care – PPO | Admitting: Psychology

## 2017-06-12 DIAGNOSIS — F4310 Post-traumatic stress disorder, unspecified: Secondary | ICD-10-CM

## 2017-06-26 ENCOUNTER — Ambulatory Visit (INDEPENDENT_AMBULATORY_CARE_PROVIDER_SITE_OTHER): Payer: BC Managed Care – PPO | Admitting: Psychology

## 2017-06-26 DIAGNOSIS — F4310 Post-traumatic stress disorder, unspecified: Secondary | ICD-10-CM

## 2017-08-07 ENCOUNTER — Ambulatory Visit: Payer: BC Managed Care – PPO | Admitting: Psychology

## 2017-10-29 ENCOUNTER — Other Ambulatory Visit: Payer: Self-pay | Admitting: Family Medicine

## 2017-10-29 DIAGNOSIS — Z1231 Encounter for screening mammogram for malignant neoplasm of breast: Secondary | ICD-10-CM

## 2018-01-25 ENCOUNTER — Ambulatory Visit
Admission: RE | Admit: 2018-01-25 | Discharge: 2018-01-25 | Disposition: A | Discharge status: Home / Self Care | Payer: BC Managed Care – PPO | Source: Ambulatory Visit | Attending: Family Medicine | Admitting: Family Medicine

## 2018-01-25 DIAGNOSIS — Z1231 Encounter for screening mammogram for malignant neoplasm of breast: Secondary | ICD-10-CM

## 2018-01-26 ENCOUNTER — Encounter (INDEPENDENT_AMBULATORY_CARE_PROVIDER_SITE_OTHER): Payer: Self-pay

## 2018-01-27 ENCOUNTER — Encounter: Payer: Self-pay | Admitting: Women's Health

## 2018-01-27 ENCOUNTER — Ambulatory Visit: Payer: BC Managed Care – PPO | Admitting: Women's Health

## 2018-01-27 VITALS — BP 124/80

## 2018-01-27 DIAGNOSIS — R350 Frequency of micturition: Secondary | ICD-10-CM

## 2018-01-27 DIAGNOSIS — N898 Other specified noninflammatory disorders of vagina: Secondary | ICD-10-CM | POA: Diagnosis not present

## 2018-01-27 LAB — WET PREP FOR TRICH, YEAST, CLUE

## 2018-01-27 NOTE — Addendum Note (Signed)
Addended by: Tito DineBONHAM, KIM A on: 01/27/2018 02:02 PM   Modules accepted: Orders

## 2018-01-27 NOTE — Progress Notes (Signed)
Joan Marshall 11-Aug-1961 [id number]  History: 57 y.o.  DWF G2P2 presents with complaint of vaginal odor that has been present for a year. Worse after exercise, has not tried any medications to alleviate symptoms. Has some tenderness over the suprapubic region, and bilateral lower back. Has urinary frequency, and slight vaginal itchiness. Denies vaginal discharge, hematuria, fever,or vomiting. 01/07/2017 not sexually active since last STD screen. Postmenopausal on no HRT with no bleeding. Hypercholesteremia managed by primary care physician.  Past medical history, past surgical history, family history and social history were all reviewed and documented in the EPIC chart. Teacher, and has a job Copyinterview tomorrow for a new position. Swims and does yoga for exercise, and eats a well-balanced diet.  ROS:  A ROS was performed and pertinent positives and negatives are included.  Exam: General appearance:  Normal, alert and oriented, well-developed, well-nourished CVAT: none Gentitourinary   Inguinal/mons:  Normal without inguinal adenopathy  External genitalia:  Normal  BUS/Urethra/Skene's glands:  Normal  Vagina:  Normal, wet prep negative, no odor noted  Cervix:  Normal  Anus and perineum: Normal, small less than 1 cm prolapsed hemorrhoid Wet prep negative UA: Trace leukocytes, trace ketones, WBC 6-10, Squamous epithelium 6-10, Few bacteria.  Assessment/Plan:  57 y.o.  DWF G2P2 presents with vaginal odor.  Normal vaginal exam  1) Reviewed normality of wet prep and exam.  Reassurance given regarding results.  Also reviewed normal mammogram from last week.  Urine Culture pending.   Joan Marshall [facility]NP, 12:45 PM 01/27/2018

## 2018-01-29 LAB — URINALYSIS, COMPLETE W/RFL CULTURE
Bilirubin Urine: NEGATIVE
GLUCOSE, UA: NEGATIVE
Hgb urine dipstick: NEGATIVE
Hyaline Cast: NONE SEEN /LPF
NITRITES URINE, INITIAL: NEGATIVE
PH: 6 (ref 5.0–8.0)
Protein, ur: NEGATIVE
RBC / HPF: NONE SEEN /HPF (ref 0–2)
SPECIFIC GRAVITY, URINE: 1.02 (ref 1.001–1.03)

## 2018-01-29 LAB — URINE CULTURE
MICRO NUMBER: 90709942
Result:: NO GROWTH
SPECIMEN QUALITY: ADEQUATE

## 2018-01-29 LAB — CULTURE INDICATED

## 2018-08-02 ENCOUNTER — Other Ambulatory Visit: Payer: Self-pay

## 2018-08-02 ENCOUNTER — Emergency Department (HOSPITAL_COMMUNITY)
Admission: EM | Admit: 2018-08-02 | Discharge: 2018-08-02 | Disposition: A | Discharge status: Home / Self Care | Payer: BC Managed Care – PPO | Attending: Emergency Medicine | Admitting: Emergency Medicine

## 2018-08-02 ENCOUNTER — Encounter (HOSPITAL_COMMUNITY): Payer: Self-pay

## 2018-08-02 DIAGNOSIS — Z87891 Personal history of nicotine dependence: Secondary | ICD-10-CM | POA: Insufficient documentation

## 2018-08-02 DIAGNOSIS — Z23 Encounter for immunization: Secondary | ICD-10-CM | POA: Insufficient documentation

## 2018-08-02 DIAGNOSIS — Z79899 Other long term (current) drug therapy: Secondary | ICD-10-CM | POA: Insufficient documentation

## 2018-08-02 DIAGNOSIS — W228XXA Striking against or struck by other objects, initial encounter: Secondary | ICD-10-CM | POA: Insufficient documentation

## 2018-08-02 DIAGNOSIS — Y998 Other external cause status: Secondary | ICD-10-CM | POA: Diagnosis not present

## 2018-08-02 DIAGNOSIS — Y939 Activity, unspecified: Secondary | ICD-10-CM | POA: Diagnosis not present

## 2018-08-02 DIAGNOSIS — S0101XA Laceration without foreign body of scalp, initial encounter: Secondary | ICD-10-CM | POA: Insufficient documentation

## 2018-08-02 DIAGNOSIS — Y929 Unspecified place or not applicable: Secondary | ICD-10-CM | POA: Insufficient documentation

## 2018-08-02 MED ORDER — TETANUS-DIPHTH-ACELL PERTUSSIS 5-2.5-18.5 LF-MCG/0.5 IM SUSP
0.5000 mL | Freq: Once | INTRAMUSCULAR | Status: AC
  Administered 2018-08-02: 0.5 mL via INTRAMUSCULAR
  Filled 2018-08-02: qty 0.5

## 2018-08-02 MED ORDER — LIDOCAINE-EPINEPHRINE (PF) 2 %-1:200000 IJ SOLN
10.0000 mL | Freq: Once | INTRAMUSCULAR | Status: AC
  Administered 2018-08-02: 10 mL
  Filled 2018-08-02: qty 20

## 2018-08-02 NOTE — ED Triage Notes (Signed)
Pt states she hit her head on her cabinet door. She states she laceration to the back of her head. No LOC. Bleeding controlled.

## 2018-08-02 NOTE — Discharge Instructions (Addendum)
Please read attached information. If you experience any new or worsening signs or symptoms please return to the emergency room for evaluation. Please follow-up with your primary care provider or specialist as discussed.  °

## 2018-08-02 NOTE — ED Provider Notes (Signed)
[facility]   CSN: [id number] Arrival date & time: 08/02/18  [id number]     History   Chief Complaint Chief Complaint  Patient presents with  . Head Laceration    HPI Joan Marshall is a 57 y.o. female.  HPI   57 year old female presents today status post head injury.  Patient notes she was going to stand and struck her head on the corner of a cabinet.  She notes no loss of consciousness no significant neurological deficits neck pain or any other injuries.  Bleeding controlled with direct pressure.  Uncertain when her last tetanus shot was.  No other complaints here.   Past Medical History:  Diagnosis Date  . Anxiety   . Appendicitis   . Ovarian cyst     Patient Active Problem List   Diagnosis Date Noted  . Chronic fatigue 06/01/2013  . Depression 03/11/2013  . EBV infection 11/22/2012  . Ovarian cyst     Past Surgical History:  Procedure Laterality Date  . APPENDECTOMY    . PELVIC LAPAROSCOPY       OB History    Gravida  3   Para  2   Term  2   Preterm      AB  1   Living  2     SAB      TAB      Ectopic      Multiple      Live Births              Home Medications    Prior to Admission medications   Medication Sig Start Date End Date Taking? Authorizing Provider  Amphetamine-Dextroamphetamine (ADDERALL PO) Take 15 mg by mouth.     [provider]  Bioflavonoid Products (ESTER-C) TABS Take 2 tablets by mouth.    [provider]  FLUoxetine (PROZAC) 40 MG capsule Take 40 mg by mouth daily.      [provider]  lamoTRIgine (LAMICTAL) 25 MG tablet Take 1 tablet by mouth 2 (two) times daily. 01/21/18   [provider]  TRAZODONE HCL PO Take 50 mg by mouth at bedtime as needed.     [provider]    Family History Family History  Problem Relation Age of Onset  . Diabetes Mother   . Hypertension Mother   . Melanoma Father   . Heart attack  Brother        Age 55  . Breast cancer Paternal Aunt        age 55's  . Breast cancer Paternal Aunt        Age 950's  . Pancreatitis Brother   . Pancreatitis Brother     Social History Social History   Tobacco Use  . Smoking status: Former Games developermoker  . Smokeless tobacco: Never Used  Substance Use Topics  . Alcohol use: No  . Drug use: No     Allergies   Patient has no known allergies.   Review of Systems Review of Systems  All other systems reviewed and are negative.  Physical Exam Updated Vital Signs BP 126/78 (BP Location: Right Arm)   Pulse 81   Temp 98.4 F (36.9 C) (Oral)   Resp 18   LMP 04/24/2012   SpO2 100%   Physical Exam Vitals signs and nursing note reviewed.  Constitutional:      Appearance: She is well-developed.  HENT:     Head: Normocephalic and atraumatic.  Comments: 1.5 cm laceration scalp - no bone involvement - no depressions Eyes:     General: No scleral icterus.       Right eye: No discharge.        Left eye: No discharge.     Conjunctiva/sclera: Conjunctivae normal.     Pupils: Pupils are equal, round, and reactive to light.  Neck:     Musculoskeletal: Normal range of motion.     Vascular: No JVD.     Trachea: No tracheal deviation.  Pulmonary:     Effort: Pulmonary effort is normal.     Breath sounds: No stridor.  Musculoskeletal:     Comments: No cervical spinal tenderness to palpation  Neurological:     Mental Status: She is alert and oriented to person, place, and time.     Coordination: Coordination normal.  Psychiatric:        Behavior: Behavior normal.        Thought Content: Thought content normal.        Judgment: Judgment normal.     ED Treatments / Results  Labs (all labs ordered are listed, but only abnormal results are displayed) Labs Reviewed - No data to display  EKG None  Radiology No results found.  Procedures .Marland KitchenLaceration Repair Date/Time: 08/02/2018 8:20 AM Performed by: Eyvonne Mechanic,  PA-C Authorized by: Eyvonne Mechanic, PA-C   Consent:    Consent obtained:  Verbal   Consent given by:  Patient   Risks discussed:  Pain   Alternatives discussed:  No treatment Anesthesia (see MAR for exact dosages):    Anesthesia method:  Local infiltration   Local anesthetic:  Lidocaine 2% WITH epi Laceration details:    Location:  Scalp   Length (cm):  1.5 Repair type:    Repair type:  Simple Exploration:    Hemostasis achieved with:  Direct pressure Treatment:    Area cleansed with:  Saline   Amount of cleaning:  Standard   Irrigation solution:  Sterile saline Skin repair:    Repair method:  Sutures   Suture size:  3-0   Suture material:  Fast-absorbing gut   Suture technique:  Simple interrupted   Number of sutures:  3 Approximation:    Approximation:  Close Post-procedure details:    Dressing:  Open (no dressing)   Patient tolerance of procedure:  Tolerated well, no immediate complications   (including critical care time)  Medications Ordered in ED Medications  Tdap (BOOSTRIX) injection 0.5 mL (has no administration in time range)  lidocaine-EPINEPHrine (XYLOCAINE W/EPI) 2 %-1:200000 (PF) injection 10 mL (10 mLs Infiltration Given 08/02/18 0755)     Initial Impression / Assessment and Plan / ED Course  I have reviewed the triage vital signs and the nursing notes.  Pertinent labs & imaging results that were available during my care of the patient were reviewed by me and considered in my medical decision making (see chart for details).     Labs:   Imaging:  Consults:   Therapeutics: Lidocaine with epinephrine  Discharge Meds:   Assessment/Plan: 57 year old female presents today with scalp laceration.  Repaired here without complication.  Discharged with symptomatic care and return precautions.  She verbalized understanding and agreement to today's plan.   Final Clinical Impressions(s) / ED Diagnoses   Final diagnoses:  Laceration of scalp, initial  encounter    ED Discharge Orders    None       Rosalio Loud 08/02/18 0830    Rolan Bucco, MD  08/02/18 0935  

## 2019-04-14 ENCOUNTER — Other Ambulatory Visit: Payer: Self-pay | Admitting: Family Medicine

## 2019-04-14 DIAGNOSIS — Z1231 Encounter for screening mammogram for malignant neoplasm of breast: Secondary | ICD-10-CM

## 2019-05-26 ENCOUNTER — Ambulatory Visit: Payer: BC Managed Care – PPO

## 2020-09-20 ENCOUNTER — Other Ambulatory Visit: Payer: Self-pay | Admitting: Family Medicine

## 2020-09-20 DIAGNOSIS — N644 Mastodynia: Secondary | ICD-10-CM

## 2020-11-30 ENCOUNTER — Other Ambulatory Visit: Payer: Self-pay

## 2020-11-30 ENCOUNTER — Ambulatory Visit: Payer: BC Managed Care – PPO

## 2020-11-30 ENCOUNTER — Ambulatory Visit
Admission: RE | Admit: 2020-11-30 | Discharge: 2020-11-30 | Disposition: A | Discharge status: Home / Self Care | Payer: BC Managed Care – PPO | Source: Ambulatory Visit | Attending: Family Medicine | Admitting: Family Medicine

## 2020-11-30 DIAGNOSIS — N644 Mastodynia: Secondary | ICD-10-CM

## 2021-05-03 ENCOUNTER — Ambulatory Visit
Admission: RE | Admit: 2021-05-03 | Discharge: 2021-05-03 | Disposition: A | Discharge status: Home / Self Care | Payer: BC Managed Care – PPO | Source: Ambulatory Visit | Attending: Family Medicine | Admitting: Family Medicine

## 2021-05-03 ENCOUNTER — Other Ambulatory Visit: Payer: Self-pay

## 2021-05-03 ENCOUNTER — Other Ambulatory Visit: Payer: Self-pay | Admitting: Family Medicine

## 2021-05-03 DIAGNOSIS — R059 Cough, unspecified: Secondary | ICD-10-CM

## 2022-05-05 IMAGING — MG DIGITAL DIAGNOSTIC BILAT W/ TOMO W/ CAD
8 series · 9 of 24 positions shown · non-contrast
Comparison: Previous exam(s).

CLINICAL DATA: Diffuse left breast pain.

EXAM:
DIGITAL DIAGNOSTIC BILATERAL MAMMOGRAM WITH TOMOSYNTHESIS AND CAD
TECHNIQUE: Bilateral digital diagnostic mammography and breast tomosynthesis
was performed. The images were evaluated with computer-aided
detection.

[R CC synth-2D]
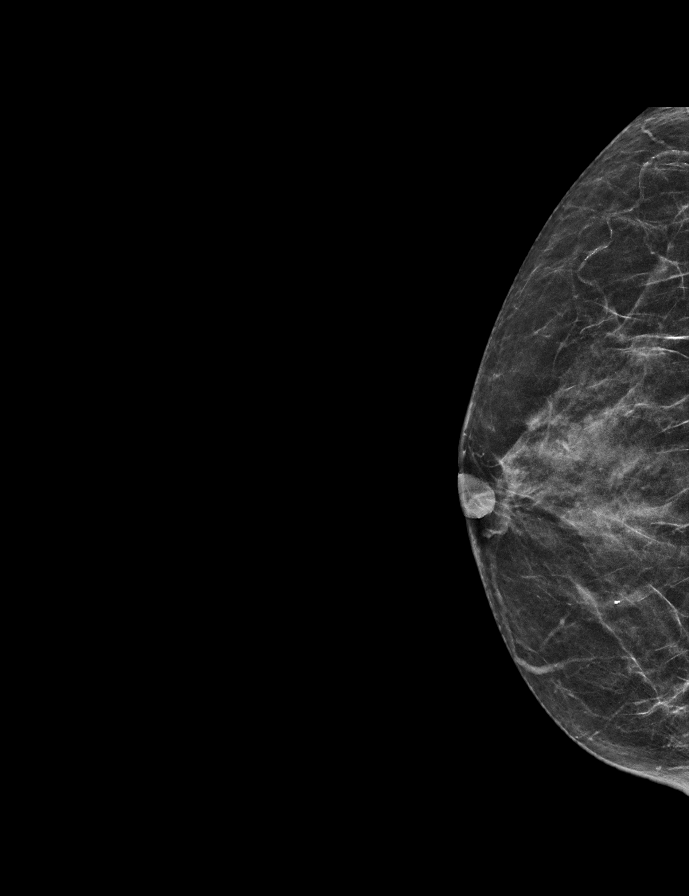

[L MLO synth-2D]
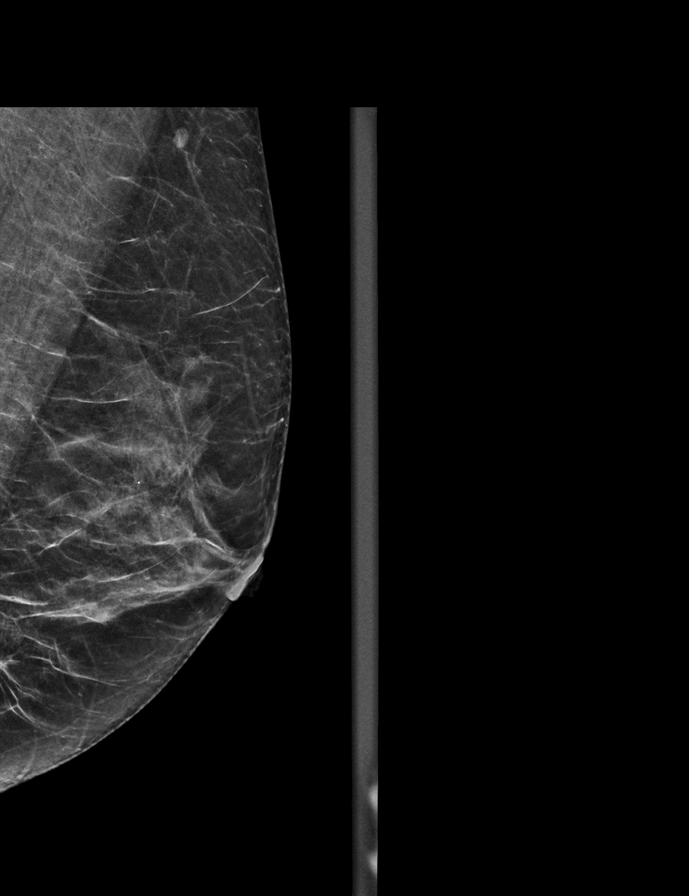

[L CC synth-2D]
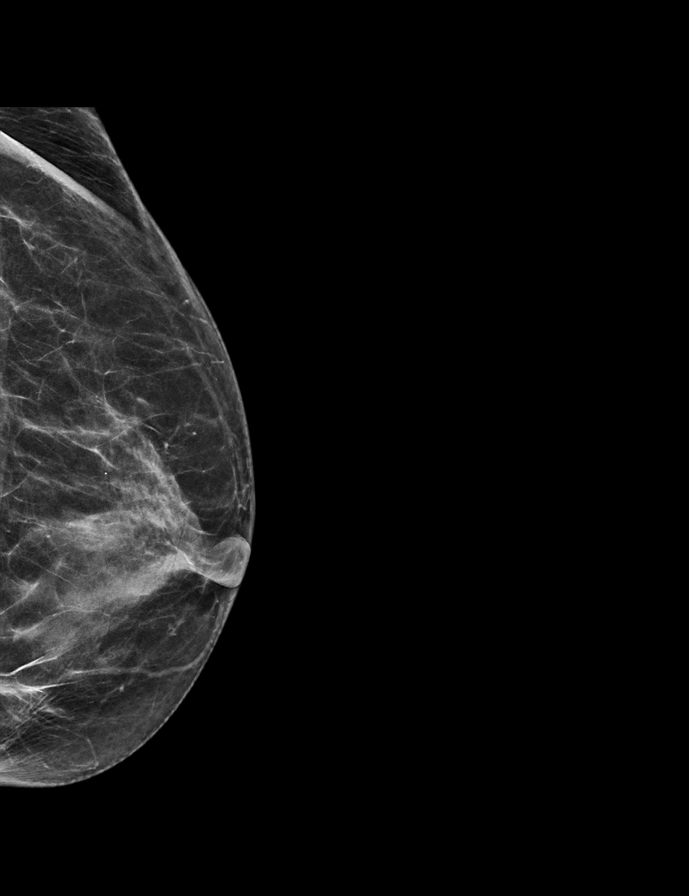

[R MLO synth-2D]
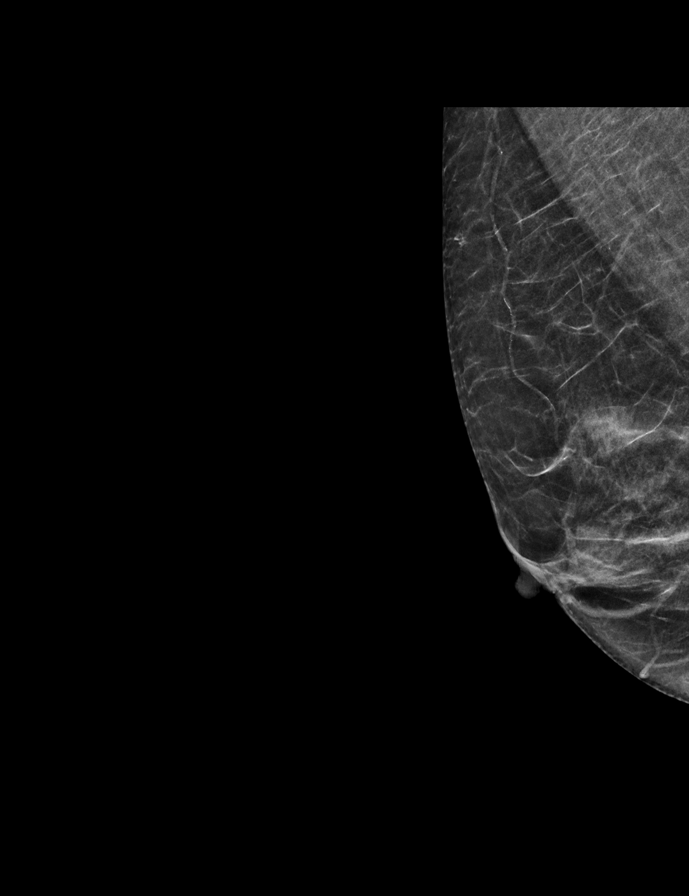

[L CC tomo · 2 of 53 frames shown]
[frame 18/53]
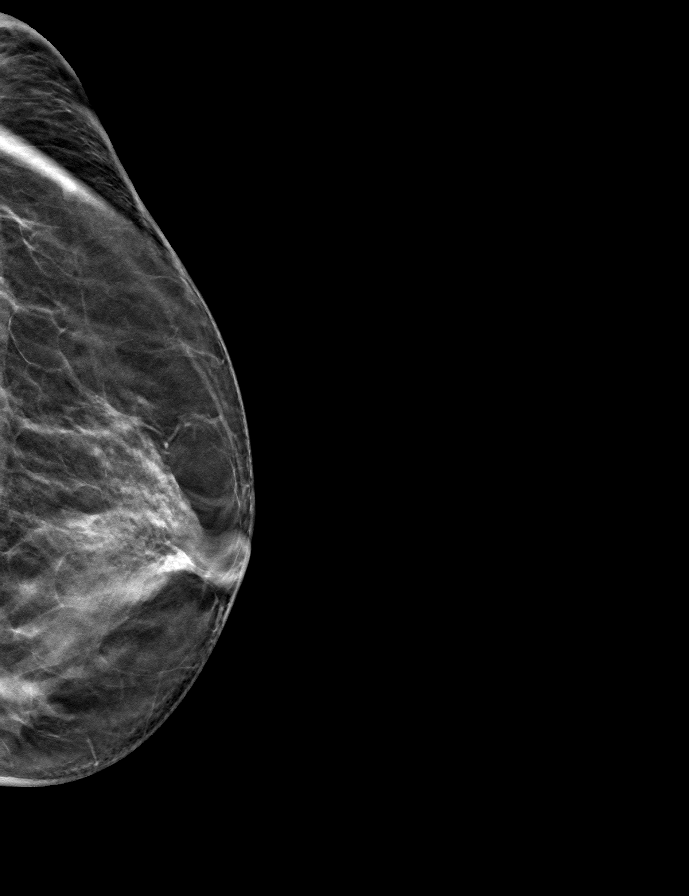
[frame 27/53]
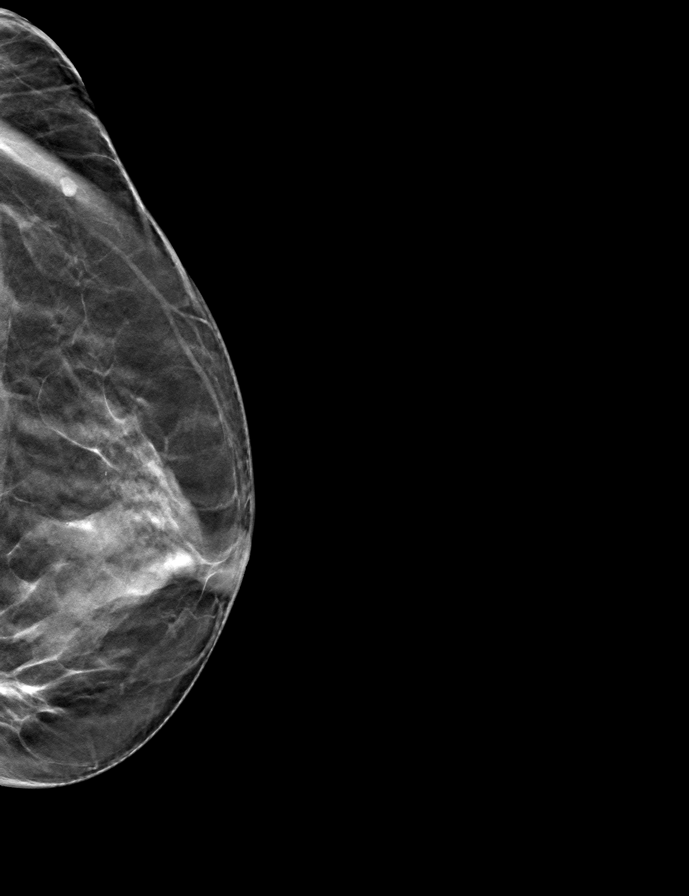

[L MLO tomo · tomo slice 23/45.0]
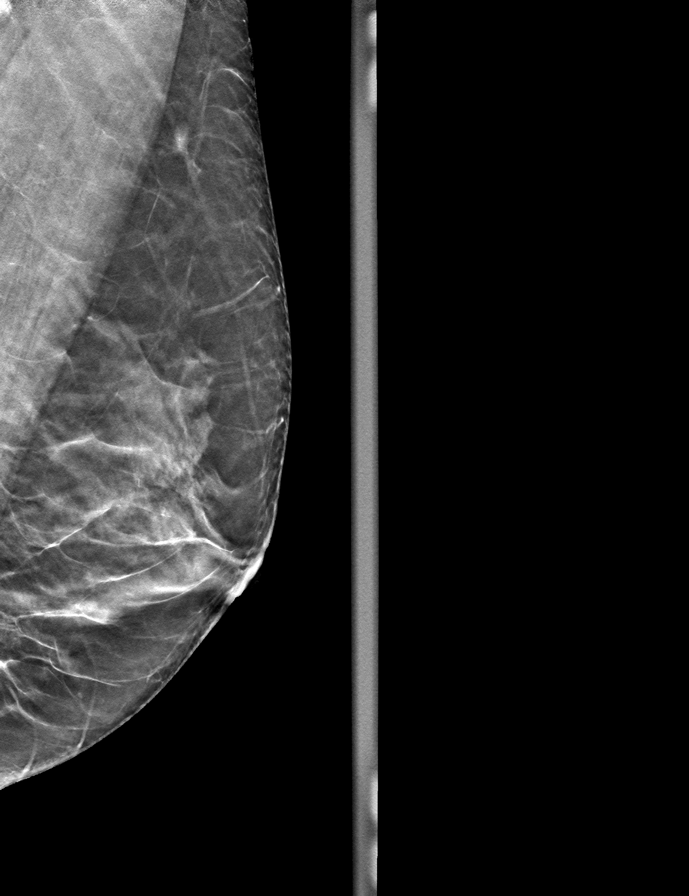

[R MLO tomo · tomo slice 23/44.0]
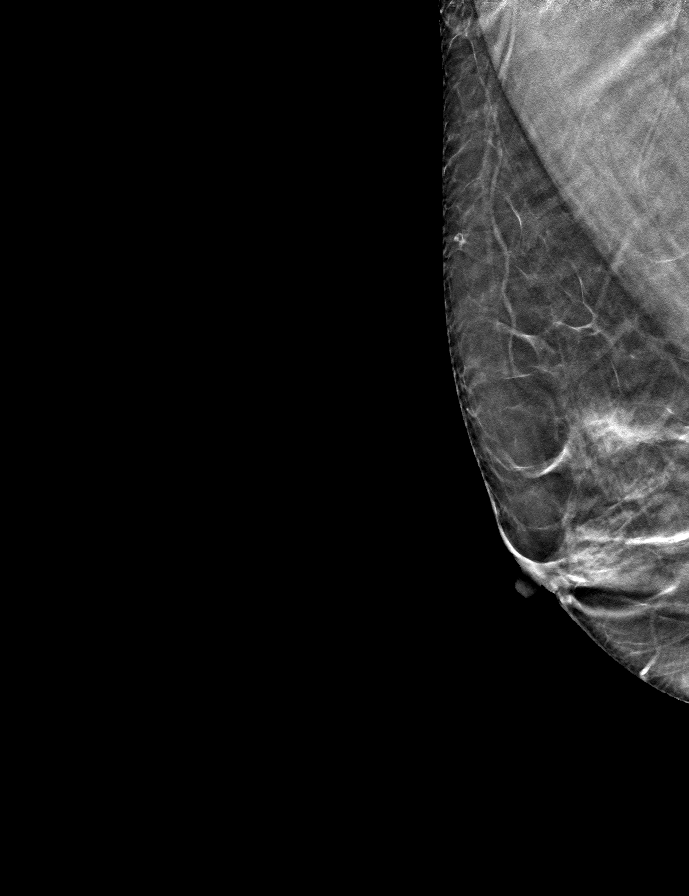

[R CC tomo · tomo slice 23/44.0]
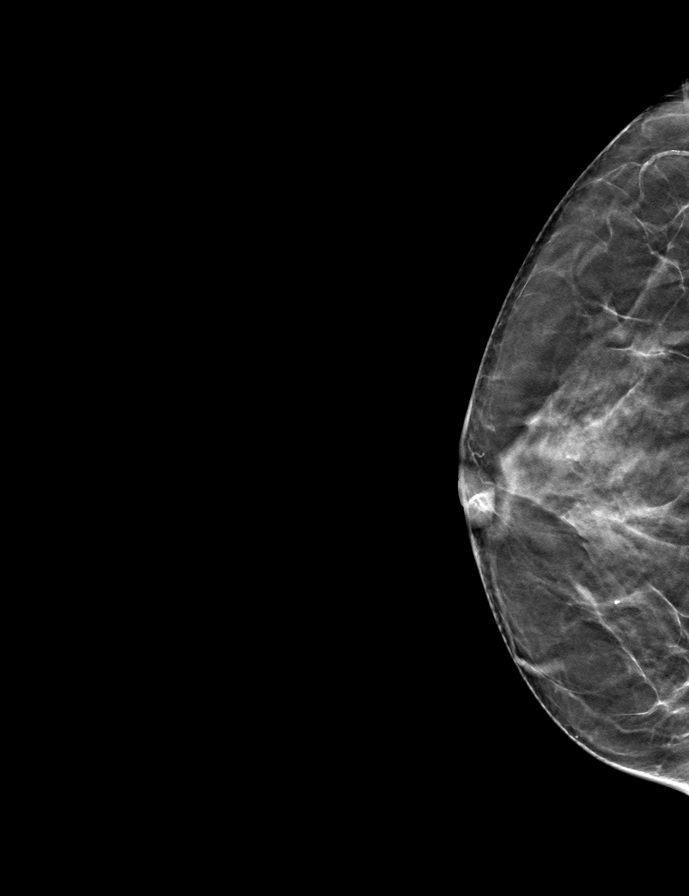

[9 of 24 positions shown; findings below may reference images not displayed]

ACR Breast Density Category c: The breast tissue is heterogeneously
dense, which may obscure small masses.
FINDINGS: No suspicious masses, calcifications, or distortion are identified
in either breast.
IMPRESSION: No mammographic evidence of malignancy.

RECOMMENDATION:
Annual screening mammography.

I have discussed the findings and recommendations with the patient.
If applicable, a reminder letter will be sent to the patient
regarding the next appointment.

BI-RADS CATEGORY  1: Negative.

## 2022-08-18 DIAGNOSIS — M8580 Other specified disorders of bone density and structure, unspecified site: Secondary | ICD-10-CM

## 2022-08-18 HISTORY — DX: Other specified disorders of bone density and structure, unspecified site: M85.80

## 2022-10-06 ENCOUNTER — Other Ambulatory Visit: Payer: Self-pay | Admitting: Family Medicine

## 2022-10-06 DIAGNOSIS — Z1231 Encounter for screening mammogram for malignant neoplasm of breast: Secondary | ICD-10-CM

## 2022-10-08 NOTE — Progress Notes (Signed)
62 y.o. [id number] Divorced other or two or more races female here for NEW GYN/annual exam.    Vaginal dryness, which is improved.   Stopped statin.   Some reflux.   Sees Dr. Toy Care and is in therapy.   Takes Vit D, MVI, vit B 12.    Retired Pharmacist, hospital.  10 and 60 yo.  Has a grand daughter.  Daughter living in [place].  PCP:   Horald Pollen, MD Psychiatry:  Dr. Toy Care  Patient's last menstrual period was 04/24/2012.           Sexually active: Yes.    The current method of family planning is post menopausal status.    Exercising: Yes.     Running, yoga, water aerobics Smoker:  former  Health Maintenance: Pap:  11/11/11 History of abnormal Pap:  no MMG:  scheduled April 2024, 11/30/20 Breast Density Category C, BI-RADS CATEGORY 1 neg Colonoscopy:  2022, polyp - Dr. Earlean Shawl.  BMD:   03/22/13  Result  low bone mass TDaP:  08/02/18 Gardasil:   no Screening Labs:  PCP   reports that she has quit smoking. She has never used smokeless tobacco. She reports that she does not drink alcohol and does not use drugs.  Past Medical History:  Diagnosis Date   Anxiety    Appendicitis    Hyperlipidemia    Ovarian cyst     Past Surgical History:  Procedure Laterality Date   APPENDECTOMY     PELVIC LAPAROSCOPY      Current Outpatient Medications  Medication Sig Dispense Refill   Amphetamine-Dextroamphetamine (ADDERALL PO) Take 15 mg by mouth.      FLUoxetine (PROZAC) 40 MG capsule Take 40 mg by mouth daily.       lamoTRIgine (LAMICTAL) 25 MG tablet Take 1 tablet by mouth 2 (two) times daily.  0   levothyroxine (SYNTHROID) 25 MCG tablet Take 25 mcg by mouth daily.     TRAZODONE HCL PO Take 50 mg by mouth at bedtime as needed.      atorvastatin (LIPITOR) 20 MG tablet Take 20 mg by mouth daily. (Patient not taking: Reported on 10/22/2022)     No current facility-administered medications for this visit.    Family History  Problem Relation Age of Onset   Diabetes Mother    Hypertension  Mother    Melanoma Father    Heart attack Brother        Age 33   Breast cancer Paternal 42        age 53's   Breast cancer Paternal Aunt        Age 98's   Pancreatitis Brother    Pancreatitis Brother     Review of Systems  Genitourinary:  Positive for flank pain.       Dark urine    Exam:   BP 100/62 (BP Location: Right Arm, Patient Position: Sitting, Cuff Size: Normal)   Pulse 66   Ht 5' 7.5" (1.715 m)   Wt 135 lb (61.2 kg)   LMP 04/24/2012   SpO2 99%   BMI 20.83 kg/m     General appearance: alert, cooperative and appears stated age Head: normocephalic, without obvious abnormality, atraumatic Neck: no adenopathy, supple, symmetrical, trachea midline and thyroid normal to inspection and palpation Lungs: clear to auscultation bilaterally Breasts: normal appearance, no masses or tenderness, No nipple retraction or dimpling, No nipple discharge or bleeding, No axillary adenopathy Heart: regular rate and rhythm Abdomen: soft, non-tender; no masses, no organomegaly Extremities: extremities  normal, atraumatic, no cyanosis or edema Skin: skin color, texture, turgor normal. No rashes or lesions Lymph nodes: cervical, supraclavicular, and axillary nodes normal. Neurologic: grossly normal  Pelvic: External genitalia:  no lesions              No abnormal inguinal nodes palpated.              Urethra:  normal appearing urethra with no masses, tenderness or lesions              Bartholins and Skenes: normal                 Vagina: normal appearing vagina with normal color and discharge, no lesions              Cervix: no lesions              Pap taken: yes Bimanual Exam:  Uterus:  normal size, contour, position, consistency, mobility, non-tender              Adnexa: no mass, fullness, tenderness              Rectal exam: yes.  Confirms.              Anus:  normal sphincter tone, no lesions  Chaperone was present for exam:  Raquel Sarna  Assessment:   Well woman visit with  gynecologic exam. Cervical cancer screening.  STD screening.  Vaginal atrophy. Osteopenia. Dark urine.  Elevated cholesterol.  Plan: Mammogram screening discussed.  She has appt.  Self breast awareness reviewed. Pap and HR HPV collected.  GC/CT/trich testing.  She will return for HIV, RPR, and hep C testing.  Lab closed for blood draws today. Treatments for vaginal atrophy reviewed:  cooking oils, vit E, vaginal estrogen, Osphena.  She will try vit E.  Guidelines for Calcium, Vitamin D, regular exercise program including cardiovascular and weight bearing exercise. [facility].  She will schedule.  Urinalysis:  sg 1.015, pH 8.5, trace leuks, 0 - 5 WBC, NS RBC, 0 - 5 squams.  UC sent.  Mediterranean Diet information to patient.  Follow up annually and prn.   After visit summary provided.

## 2022-10-22 ENCOUNTER — Encounter: Payer: Self-pay | Admitting: Obstetrics and Gynecology

## 2022-10-22 ENCOUNTER — Other Ambulatory Visit (HOSPITAL_COMMUNITY)
Admission: RE | Admit: 2022-10-22 | Discharge: 2022-10-22 | Disposition: A | Discharge status: Home / Self Care | Payer: BC Managed Care – PPO | Source: Ambulatory Visit | Attending: Obstetrics and Gynecology | Admitting: Obstetrics and Gynecology

## 2022-10-22 ENCOUNTER — Ambulatory Visit (INDEPENDENT_AMBULATORY_CARE_PROVIDER_SITE_OTHER): Payer: BC Managed Care – PPO | Admitting: Obstetrics and Gynecology

## 2022-10-22 VITALS — BP 100/62 | HR 66 | Ht 67.5 in | Wt 135.0 lb

## 2022-10-22 DIAGNOSIS — M8580 Other specified disorders of bone density and structure, unspecified site: Secondary | ICD-10-CM | POA: Diagnosis not present

## 2022-10-22 DIAGNOSIS — Z124 Encounter for screening for malignant neoplasm of cervix: Secondary | ICD-10-CM | POA: Insufficient documentation

## 2022-10-22 DIAGNOSIS — M549 Dorsalgia, unspecified: Secondary | ICD-10-CM

## 2022-10-22 DIAGNOSIS — Z113 Encounter for screening for infections with a predominantly sexual mode of transmission: Secondary | ICD-10-CM

## 2022-10-22 DIAGNOSIS — Z1159 Encounter for screening for other viral diseases: Secondary | ICD-10-CM

## 2022-10-22 DIAGNOSIS — R82998 Other abnormal findings in urine: Secondary | ICD-10-CM

## 2022-10-22 DIAGNOSIS — Z01419 Encounter for gynecological examination (general) (routine) without abnormal findings: Secondary | ICD-10-CM | POA: Diagnosis not present

## 2022-10-22 DIAGNOSIS — Z780 Asymptomatic menopausal state: Secondary | ICD-10-CM | POA: Diagnosis not present

## 2022-10-22 DIAGNOSIS — Z114 Encounter for screening for human immunodeficiency virus [HIV]: Secondary | ICD-10-CM

## 2022-10-22 NOTE — Patient Instructions (Signed)
Mediterranean Diet A Mediterranean diet refers to food and lifestyle choices that are based on the traditions of countries located on the [employer]. It focuses on eating more fruits, vegetables, whole grains, beans, nuts, seeds, and heart-healthy fats, and eating less dairy, meat, eggs, and processed foods with added sugar, salt, and fat. This way of eating has been shown to help prevent certain conditions and improve outcomes for people who have chronic diseases, like kidney disease and heart disease. What are tips for following this plan? Reading food labels Check the serving size of packaged foods. For foods such as rice and pasta, the serving size refers to the amount of cooked product, not dry. Check the total fat in packaged foods. Avoid foods that have saturated fat or trans fats. Check the ingredient list for added sugars, such as corn syrup. Shopping  Buy a variety of foods that offer a balanced diet, including: Fresh fruits and vegetables (produce). Grains, beans, nuts, and seeds. Some of these may be available in unpackaged forms or large amounts (in bulk). Fresh seafood. Poultry and eggs. Low-fat dairy products. Buy whole ingredients instead of prepackaged foods. Buy fresh fruits and vegetables in-season from local farmers markets. Buy plain frozen fruits and vegetables. If you do not have access to quality fresh seafood, buy precooked frozen shrimp or canned fish, such as tuna, salmon, or sardines. Stock your pantry so you always have certain foods on hand, such as olive oil, canned tuna, canned tomatoes, rice, pasta, and beans. Cooking Cook foods with extra-virgin olive oil instead of using butter or other vegetable oils. Have meat as a side dish, and have vegetables or grains as your main dish. This means having meat in small portions or adding small amounts of meat to foods like pasta or stew. Use beans or vegetables instead of meat in common dishes like chili or  lasagna. Experiment with different cooking methods. Try roasting, broiling, steaming, and sauting vegetables. Add frozen vegetables to soups, stews, pasta, or rice. Add nuts or seeds for added healthy fats and plant protein at each meal. You can add these to yogurt, salads, or vegetable dishes. Marinate fish or vegetables using olive oil, lemon juice, garlic, and fresh herbs. Meal planning Plan to eat one vegetarian meal one day each week. Try to work up to two vegetarian meals, if possible. Eat seafood two or more times a week. Have healthy snacks readily available, such as: Vegetable sticks with hummus. Greek yogurt. Fruit and nut trail mix. Eat balanced meals throughout the week. This includes: Fruit: 2-3 servings a day. Vegetables: 4-5 servings a day. Low-fat dairy: 2 servings a day. Fish, poultry, or lean meat: 1 serving a day. Beans and legumes: 2 or more servings a week. Nuts and seeds: 1-2 servings a day. Whole grains: 6-8 servings a day. Extra-virgin olive oil: 3-4 servings a day. Limit red meat and sweets to only a few servings a month. Lifestyle  Cook and eat meals together with your family, when possible. Drink enough fluid to keep your urine pale yellow. Be physically active every day. This includes: Aerobic exercise like running or swimming. Leisure activities like gardening, walking, or housework. Get 7-8 hours of sleep each night. If recommended by your health care provider, drink red wine in moderation. This means 1 glass a day for nonpregnant women and 2 glasses a day for men. A glass of wine equals 5 oz (150 mL). What foods should I eat? Fruits Apples. Apricots. Avocado. Berries. Bananas. Cherries. Dates.  Figs. Grapes. Lemons. Melon. Oranges. Peaches. Plums. Pomegranate. Vegetables Artichokes. Beets. Broccoli. Cabbage. Carrots. Eggplant. Green beans. Chard. Kale. Spinach. Onions. Leeks. Peas. Squash. Tomatoes. Peppers. Radishes. Grains Whole-grain pasta. Brown  rice. Bulgur wheat. Polenta. Couscous. Whole-wheat bread. [place]. Meats and other proteins Beans. Almonds. Sunflower seeds. Pine nuts. Peanuts. [place]. Salmon. Scallops. Shrimp. [place]. Tilapia. Clams. Oysters. Eggs. Poultry without skin. Dairy Low-fat milk. Cheese. Greek yogurt. Fats and oils Extra-virgin olive oil. Avocado oil. Grapeseed oil. Beverages Water. Red wine. Herbal tea. Sweets and desserts Greek yogurt with honey. Baked apples. Poached pears. Trail mix. Seasonings and condiments Basil. Cilantro. Coriander. Cumin. Mint. Parsley. Sage. Rosemary. Tarragon. Garlic. Oregano. Thyme. Pepper. Balsamic vinegar. Tahini. Hummus. Tomato sauce. Olives. Mushrooms. The items listed above may not be a complete list of foods and beverages you can eat. Contact a dietitian for more information. What foods should I limit? This is a list of foods that should be eaten rarely or only on special occasions. Fruits Fruit canned in syrup. Vegetables Deep-fried potatoes (french fries). Grains Prepackaged pasta or rice dishes. Prepackaged cereal with added sugar. Prepackaged snacks with added sugar. Meats and other proteins Beef. Pork. Lamb. Poultry with skin. Hot dogs. Bernie[place] Salines. Dairy Ice cream. Sour cream. Whole milk. Fats and oils Butter. Canola oil. Vegetable oil. Beef fat (tallow). Lard. Beverages Juice. Sugar-sweetened soft drinks. Beer. Liquor and spirits. Sweets and desserts Cookies. Cakes. Pies. Candy. Seasonings and condiments Mayonnaise. Pre-made sauces and marinades. The items listed above may not be a complete list of foods and beverages you should limit. Contact a dietitian for more information. Summary The Mediterranean diet includes both food and lifestyle choices. Eat a variety of fresh fruits and vegetables, beans, nuts, seeds, and whole grains. Limit the amount of red meat and sweets that you eat. If recommended by your health care provider, drink red wine in moderation.  This means 1 glass a day for nonpregnant women and 2 glasses a day for men. A glass of wine equals 5 oz (150 mL). This information is not intended to replace advice given to you by your health care provider. Make sure you discuss any questions you have with your health care provider. Document Revised: 09/09/2019 Document Reviewed: 07/07/2019 Elsevier Patient Education  [place] AND DIET:  We recommended that you start or continue a regular exercise program for good health. Regular exercise means any activity that makes your heart beat faster and makes you sweat.  We recommend exercising at least 30 minutes per day at least 3 days a week, preferably 4 or 5.  We also recommend a diet low in fat and sugar.  Inactivity, poor dietary choices and obesity can cause diabetes, heart attack, stroke, and kidney damage, among others.    ALCOHOL AND SMOKING:  Women should limit their alcohol intake to no more than 7 drinks/beers/glasses of wine (combined, not each!) per week. Moderation of alcohol intake to this level decreases your risk of breast cancer and liver damage. And of course, no recreational drugs are part of a healthy lifestyle.  And absolutely no smoking or even second hand smoke. Most people know smoking can cause heart and lung diseases, but did you know it also contributes to weakening of your bones? Aging of your skin?  Yellowing of your teeth and nails?  CALCIUM AND VITAMIN D:  Adequate intake of calcium and Vitamin D are recommended.  The recommendations for exact amounts of these supplements seem to change often, but generally speaking 600  mg of calcium (either carbonate or citrate) and 800 units of Vitamin D per day seems prudent. Certain women may benefit from higher intake of Vitamin D.  If you are among these women, your doctor will have told you during your visit.    PAP SMEARS:  Pap smears, to check for cervical cancer or precancers,  have traditionally been done  yearly, although recent scientific advances have shown that most women can have pap smears less often.  However, every woman still should have a physical exam from her gynecologist every year. It will include a breast check, inspection of the vulva and vagina to check for abnormal growths or skin changes, a visual exam of the cervix, and then an exam to evaluate the size and shape of the uterus and ovaries.  And after 60 years of age, a rectal exam is indicated to check for rectal cancers. We will also provide age appropriate advice regarding health maintenance, like when you should have certain vaccines, screening for sexually transmitted diseases, bone density testing, colonoscopy, mammograms, etc.   MAMMOGRAMS:  All women over 35 years old should have a yearly mammogram. Many facilities now offer a "3D" mammogram, which may cost around $50 extra out of pocket. If possible,  we recommend you accept the option to have the 3D mammogram performed.  It both reduces the number of women who will be called back for extra views which then turn out to be normal, and it is better than the routine mammogram at detecting truly abnormal areas.    COLONOSCOPY:  Colonoscopy to screen for colon cancer is recommended for all women at age 54.  We know, you hate the idea of the prep.  We agree, BUT, having colon cancer and not knowing it is worse!!  Colon cancer so often starts as a polyp that can be seen and removed at colonscopy, which can quite literally save your life!  And if your first colonoscopy is normal and you have no family history of colon cancer, most women don't have to have it again for 10 years.  Once every ten years, you can do something that may end up saving your life, right?  We will be happy to help you get it scheduled when you are ready.  Be sure to check your insurance coverage so you understand how much it will cost.  It may be covered as a preventative service at no cost, but you should check your  particular policy.

## 2022-10-23 ENCOUNTER — Other Ambulatory Visit: Payer: BC Managed Care – PPO

## 2022-10-23 LAB — CULTURE INDICATED

## 2022-10-23 LAB — CYTOLOGY - PAP
Chlamydia: NEGATIVE
Comment: NEGATIVE
Comment: NEGATIVE
Comment: NEGATIVE
Comment: NORMAL
Diagnosis: NEGATIVE
High risk HPV: NEGATIVE
Neisseria Gonorrhea: NEGATIVE
Trichomonas: NEGATIVE

## 2022-10-23 LAB — URINALYSIS, COMPLETE W/RFL CULTURE
Bacteria, UA: NONE SEEN /HPF
Bilirubin Urine: NEGATIVE
Glucose, UA: NEGATIVE
Hgb urine dipstick: NEGATIVE
Hyaline Cast: NONE SEEN /LPF
Ketones, ur: NEGATIVE
Nitrites, Initial: NEGATIVE
Protein, ur: NEGATIVE
RBC / HPF: NONE SEEN /HPF (ref 0–2)
Specific Gravity, Urine: 1.009 (ref 1.001–1.035)
pH: 8.5 — ABNORMAL HIGH (ref 5.0–8.0)

## 2022-10-23 LAB — URINE CULTURE
MICRO NUMBER:: 14656615
Result:: NO GROWTH
SPECIMEN QUALITY:: ADEQUATE

## 2022-10-27 ENCOUNTER — Other Ambulatory Visit: Payer: BC Managed Care – PPO

## 2022-11-19 ENCOUNTER — Ambulatory Visit
Admission: RE | Admit: 2022-11-19 | Discharge: 2022-11-19 | Disposition: A | Discharge status: Home / Self Care | Payer: BC Managed Care – PPO | Source: Ambulatory Visit | Attending: Family Medicine | Admitting: Family Medicine

## 2022-11-19 DIAGNOSIS — Z1231 Encounter for screening mammogram for malignant neoplasm of breast: Secondary | ICD-10-CM
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
## 2023-01-06 ENCOUNTER — Ambulatory Visit (INDEPENDENT_AMBULATORY_CARE_PROVIDER_SITE_OTHER): Payer: BC Managed Care – PPO | Admitting: Family Medicine

## 2023-01-06 ENCOUNTER — Encounter: Payer: Self-pay | Admitting: Family Medicine

## 2023-01-06 VITALS — Ht 67.0 in | Wt 135.2 lb

## 2023-01-06 DIAGNOSIS — M8580 Other specified disorders of bone density and structure, unspecified site: Secondary | ICD-10-CM

## 2023-01-06 DIAGNOSIS — E785 Hyperlipidemia, unspecified: Secondary | ICD-10-CM | POA: Diagnosis not present

## 2023-01-06 DIAGNOSIS — Z780 Asymptomatic menopausal state: Secondary | ICD-10-CM

## 2023-01-06 NOTE — Patient Instructions (Addendum)
-   Cholesterol is found only in foods that come from an animal that had a liver, i.e., not found in plant foods.    Carbohydrate includes starch, sugar, and fiber.  Of these, only sugar and starch raise blood glucose or provide calories.  (Fiber is found in fruits, vegetables [especially skin, seeds, and stalks], whole grains, and beans.)   Starchy (carb) foods: Bread, rice, pasta, potatoes, corn, cereal, grits, crackers, bagels, muffins, all baked goods.  (Fruit, milk, and yogurt also have carbohydrate, but most of these foods will not spike your blood sugar as most starchy or sweet foods will.)   Protein sources (Aim for at least 20 grams protein per meal):    1 egg or 1 ounce of meat, fish, poultry, or cheese provides about 7 grams of protein.  8 oz of milk or 2 tbsp peanut butter provides 8 grams of protein. 1 cup (8 oz) of most beans provides 15 grams of protein.    Yogurts vary greatly, but [place] yogurt is highest in protein.    When struggling with a  food decision, always ask ALL THREE questions (not one or two): What am I in the mood for? How hungry am I? What's good for me? Keep in mind that "what is good for you" can be interpreted in very different ways depending on the circumstances.  At one time the answer might be an apple, while at another time, the answer might be a scoop of ice cream.  The best food decision includes optimizing satisfaction.     Specific Nutrition Concerns for you:  - Elevated LDL, triglycerides, and total cholesterol - Low bone density (osteopenia) - History of disordered eating  Nutrition principles important to addressing the above concerns:  1. Eat at least 3 REAL meals and 1-2 snacks per day.  Aim for no more than 5 hours between eating.   A REAL breakfast includes at least some protein and some starch (fruit optional).   A REAL lunch or dinner includes at least some protein, some starch, and vegetables and/or fruit.   (OR: Would you serve this to a  guest in your home, and call it a meal?)  2. Obtain adequate protein - to allow sufficient protein for optimal biological functioning (including immune function and bone health, as well as maintenance of muscle protein).  This means get at least 20 grams protein per meal.    Your protein RDA is only 49 grams, but you will be better served with an intake of ~60-70.   Consistent protein might help you manage appetite and blood sugar, which helps to keep mood stable.    3. For lunch and dinner, an ideal template is half the volume of the meal coming from veg's, with 1/4 each from protein and starch.    Let's continue this conversation at a follow-up appt where we will address hyperlipidemia and bone health.   Follow-up appt on Tuesday, June 4 at 3 PM.

## 2023-01-20 ENCOUNTER — Ambulatory Visit (INDEPENDENT_AMBULATORY_CARE_PROVIDER_SITE_OTHER): Payer: BC Managed Care – PPO | Admitting: Family Medicine

## 2023-01-20 VITALS — Ht 67.0 in | Wt 132.0 lb

## 2023-01-20 DIAGNOSIS — M8580 Other specified disorders of bone density and structure, unspecified site: Secondary | ICD-10-CM

## 2023-01-20 DIAGNOSIS — E785 Hyperlipidemia, unspecified: Secondary | ICD-10-CM

## 2023-01-20 DIAGNOSIS — Z780 Asymptomatic menopausal state: Secondary | ICD-10-CM
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
## 2023-01-20 NOTE — Patient Instructions (Addendum)
When struggling with a food decision, always ask ALL THREE questions (not one or two): What am I in the mood for? How hungry am I? What's good for me? Keep in mind that "what is good for you" can be interpreted in very different ways depending on the circumstances.   YOU are the best judge of whether or not a food choices is good for you.    Self-inventory from time to time, but especially if you are feeling out of sorts:  HAALT: Hungry, Angry, Anxious, Lonely, Tired (& Bored and Depressed).    Recommendations for bone health:  1. Supplement your diet with 250 mg calcium citrate 1 to 2 X day.  (Avoid taking at the same time as foods high in calcium, as this will decrease the proportion of total calcium that is absorbed.) 2. Check serum vitamin D levels, and supplement as needed to maintain a vitamin D of 30-40 ng/mL.  (Most people seem to need ~1000 IU vitamin D3 per day.)   3. Continue weight-bearing exercise (resume running when you can).   4. Getting adequate protein and energy (calories) is also important for your bone health.    Recommendation: Obtain at least 20 grams of protein per meal.   (The size of a deck of cards is equal to about 3-4 ounce of meat, fish, or poultry.)  1 egg or 1 ounce of meat, fish, poultry, or cheese or 1/4 of most nuts/seeds provides about 7 grams of protein.  8 oz of milk or 2 tbsp peanut butter provides 8 grams of protein. 1 cup (8 oz) of most beans provides 15 grams of protein.   (If beans are your main protein source at a meal, consider a serving to be at least 1 full cup.)  Yogurts vary greatly, but [place] yogurt is highest in protein.   Make sure you set aside a few minutes each week to plan foods for the upcoming week.    Your specific behavioral goals:  1. Eat at least 3 REAL meals and 1-2 snacks per day.  Aim for no more than 5 hours between eating.   A REAL breakfast includes at least some protein and some starch (fruit optional).   A REAL lunch or  dinner includes at least some protein, some starch, and vegetables and/or fruit.   (OR: Would you serve this to a guest in your home, and call it a meal?)  2. Obtain adequate protein - to allow sufficient protein for optimal biological functioning (including immune function and bone health, as well as maintenance of muscle protein).  This means get at least 20 grams protein per meal.    Your protein RDA is only 49 grams, but you will be better served with an intake of ~60-70.   Consistent protein might help you manage appetite and blood sugar, which helps to keep mood stable.    3. For lunch and dinner, an ideal template is half the volume of the meal coming from veg's, with 1/4 each from protein and starch.    Let's continue this conversation at a follow-up appt where we will address hyperlipidemia and bone health.    Follow-up appt on as needed.  Call [phone]) or email [email or web address].

## 2023-04-21 ENCOUNTER — Other Ambulatory Visit: Payer: BC Managed Care – PPO

## 2023-06-26 ENCOUNTER — Encounter: Payer: Self-pay | Admitting: Otolaryngology

## 2023-07-06 ENCOUNTER — Ambulatory Visit
Admission: RE | Admit: 2023-07-06 | Discharge: 2023-07-06 | Disposition: A | Discharge status: Home / Self Care | Payer: BC Managed Care – PPO | Source: Ambulatory Visit | Attending: Obstetrics and Gynecology | Admitting: Obstetrics and Gynecology

## 2023-07-06 DIAGNOSIS — M8580 Other specified disorders of bone density and structure, unspecified site: Secondary | ICD-10-CM

## 2023-07-06 DIAGNOSIS — Z780 Asymptomatic menopausal state: Secondary | ICD-10-CM

## 2023-07-10 ENCOUNTER — Encounter: Payer: Self-pay | Admitting: Obstetrics and Gynecology

## 2023-10-19 ENCOUNTER — Telehealth: Payer: Self-pay | Admitting: Pharmacy Technician

## 2023-10-19 ENCOUNTER — Ambulatory Visit (HOSPITAL_BASED_OUTPATIENT_CLINIC_OR_DEPARTMENT_OTHER): Payer: 59 | Admitting: Cardiovascular Disease

## 2023-10-19 ENCOUNTER — Encounter (HOSPITAL_BASED_OUTPATIENT_CLINIC_OR_DEPARTMENT_OTHER): Payer: Self-pay | Admitting: Cardiovascular Disease

## 2023-10-19 ENCOUNTER — Other Ambulatory Visit (HOSPITAL_COMMUNITY): Payer: Self-pay

## 2023-10-19 VITALS — BP 104/64 | HR 74 | Ht 67.0 in | Wt 126.5 lb

## 2023-10-19 DIAGNOSIS — Z5181 Encounter for therapeutic drug level monitoring: Secondary | ICD-10-CM

## 2023-10-19 DIAGNOSIS — R9431 Abnormal electrocardiogram [ECG] [EKG]: Secondary | ICD-10-CM | POA: Diagnosis not present

## 2023-10-19 DIAGNOSIS — E785 Hyperlipidemia, unspecified: Secondary | ICD-10-CM | POA: Diagnosis not present

## 2023-10-19 MED ORDER — REPATHA SURECLICK 140 MG/ML ~~LOC~~ SOAJ: 140.0000 mg | SUBCUTANEOUS | 3 refills | Status: AC

## 2023-10-19 NOTE — Progress Notes (Signed)
 Cardiology Office Note:  .   Date:  10/19/2023  ID:  Karren Cobble, DOB 03-05-1961, MRN [id number] PCP: Patient, No Pcp Per  [facility] Providers Cardiologist:  None    History of Present Illness: .    Joan Marshall is a 63 y.o. female with hyperlipidemia, family history of premature CAD, anorexia, and asthma who is being seen today for the evaluation of hyperlipidemia and abnormal EKG at the request of Dois Davenport, MD.  She saw Dr. Hal Hope 05/2023 and discussed her hyperlipidemia.  They noted that her total cholesterol was 275 down from 290 in the past.  Her LDL was 199.  She had a strong family history of CAD with a brother who had a heart attack at age 58 and a sister in her 86s.  She has not tolerated statins in the past.  She also tried Zetia and did not tolerate it.  Therefore, she was referred to cardiology for further evaluation.  Discussed the use of AI scribe software for clinical note transcription with the patient, who gave verbal consent to proceed.  History of Present Illness   Joan Marshall has a strong family history of heart disease, with two siblings having had heart attacks. She has been on multiple statins in the past, including atorvastatin, rosuvastatin, and pravastatin, but did not tolerate them well. Currently, she is on ezetimibe, which has helped reduce her cholesterol levels somewhat. Her total cholesterol has decreased from 290 to 275, and her LDL cholesterol has decreased from 199, indicating some improvement.  She is interested in hormone replacement therapy and needs to be evaluated for cardiovascular risk before proceeding. She feels excellent during physical activities and experiences no chest pain with exertion. She mentions a sensation in her chest related to gas but does not associate it with exertion.  She maintains a healthy lifestyle, exercising three to five times a week, including running, aerobics, yoga, and tennis. She eats a healthy diet, having given  up sugar a year ago, and consumes red meat occasionally. She quit smoking approximately 25 years ago after smoking from age 35 to 72 or 67.  In terms of family history, her mother experienced mini-strokes in her 59s or 9s, and her sister had a heart attack involving the 'widow maker'. She has two children, aged 43 and 71, and notes that her son's cholesterol is slightly elevated.     ROS:  As per HPI  Studies Reviewed: Marland Kitchen   EKG Interpretation Date/Time:  Monday October 19 2023 08:48:03 EST Ventricular Rate:  74 PR Interval:  152 QRS Duration:  86 QT Interval:  392 QTC Calculation: 435 R Axis:   88  Text Interpretation: Normal sinus rhythm Possible Left atrial enlargement When compared with ECG of 14-May-2016 15:11, No significant change was found Confirmed by Chilton Si ([id number]) on 10/19/2023 8:55:26 AM     EKG Interpretation Date/Time:  Monday October 19 2023 08:48:03 EST Ventricular Rate:  74 PR Interval:  152 QRS Duration:  86 QT Interval:  392 QTC Calculation: 435 R Axis:   88  Text Interpretation: Normal sinus rhythm Possible Left atrial enlargement When compared with ECG of 14-May-2016 15:11, No significant change was found Confirmed by Chilton Si ([id number]) on 10/19/2023 8:55:26 AM        Risk Assessment/Calculations:             Physical Exam:   VS:  BP 104/64   Pulse 74   Ht 5\' 7"  (1.702 m)   Wt  126 lb 8 oz (57.4 kg)   LMP 04/24/2012   SpO2 96%   BMI 19.81 kg/m  , BMI Body mass index is 19.81 kg/m. GENERAL:  Well appearing HEENT: Pupils equal round and reactive, fundi not visualized, oral mucosa unremarkable NECK:  No jugular venous distention, waveform within normal limits, carotid upstroke brisk and symmetric, no bruits, no thyromegaly LUNGS:  Clear to auscultation bilaterally HEART:  RRR.  PMI not displaced or sustained,S1 and S2 within normal limits, no S3, no S4, no clicks, no rubs, no murmurs ABD:  Flat, positive bowel sounds normal in frequency  in pitch, no bruits, no rebound, no guarding, no midline pulsatile mass, no hepatomegaly, no splenomegaly EXT:  2 plus pulses throughout, no edema, no cyanosis no clubbing SKIN:  No rashes no nodules NEURO:  Cranial nerves II through XII grossly intact, motor grossly intact throughout PSYCH:  Cognitively intact, oriented to person place and time   ASSESSMENT AND PLAN: .      # Familial Hypercholesterolemia LDL cholesterol elevated despite lifestyle modifications and Ezetimibe therapy. Likely familial hypercholesterolemia given family history of premature CAD and LDL >190. Statin intolerance reported. -Start Repatha injections every two weeks. -Continue Ezetimibe. -Check fasting lipids and comprehensive metabolic panel in 3 months. -Check LP(a) and high sensitivity CRP in 3 months.  # Cardiovascular Risk Assessment Family history of premature coronary artery disease. No personal history of cardiovascular disease. No symptoms with exercise. Normal EKG. -Order coronary artery calcium score. -Consider genetic testing for familial hypercholesterolemia.  General Health Maintenance -Follow-up appointment in early June with Nurse Practitioner Eligha Bridegroom.       Signed, Chilton Si, MD

## 2023-10-19 NOTE — Telephone Encounter (Signed)
 Pharmacy Patient Advocate Encounter   Received notification from CoverMyMeds that prior authorization for Repatha is required/requested.   Insurance verification completed.   The patient is insured through [facility] ADVANTAGE/RX ADVANCE .   Per test claim: PA required; PA submitted to above mentioned insurance via CoverMyMeds Key/confirmation #/EOC [id number] Status is pending

## 2023-10-19 NOTE — Telephone Encounter (Signed)
 Pharmacy Patient Advocate Encounter  Received notification from [facility] ADVANTAGE/RX ADVANCE that Prior Authorization for Repatha has been APPROVED from 10/18/23 to 10/17/24. Ran test claim, Copay is $141.00- 3 months. This test claim was processed through [facility]- copay amounts may vary at other pharmacies due to pharmacy/plan contracts, or as the patient moves through the different stages of their insurance plan.   PA #/Case ID/Reference #: [id number]

## 2023-10-19 NOTE — Patient Instructions (Addendum)
 Medication Instructions:  START REPATHA 140 MG EVERY 2 WEEKS   *If you need a refill on your cardiac medications before your next appointment, please call your pharmacy*  Lab Work: FASTING LP/CMET/LPa/HSCRP IN 3 MONTHS ABOUT 1 WEEK PRIOR TO FOLLOW UP   If you have labs (blood work) drawn today and your tests are completely normal, you will receive your results only by: MyChart Message (if you have MyChart) OR A paper copy in the mail If you have any lab test that is abnormal or we need to change your treatment, we will call you to review the results.  Testing/Procedures: Your physician has recommend you to have a coronary calcium score. This is a self pay test that will cost $99  Follow-Up: At [facility], you and your health needs are our priority.  As part of our continuing mission to provide you with exceptional heart care, we have created designated Provider Care Teams.  These Care Teams include your primary Cardiologist (physician) and Advanced Practice Providers (APPs -  Physician Assistants and Nurse Practitioners) who all work together to provide you with the care you need, when you need it.  We recommend signing up for the patient portal called "MyChart".  Sign up information is provided on this After Visit Summary.  MyChart is used to connect with patients for Virtual Visits (Telemedicine).  Patients are able to view lab/test results, encounter notes, upcoming appointments, etc.  Non-urgent messages can be sent to your provider as well.   To learn more about what you can do with MyChart, go to [email or web address].    Your next appointment:   12 month(s)  Provider:   Chilton Si, MD     3 MONTHS WITH MICHELLE S NP  Other Instructions  Subcutaneous Injection Instructions Using a Prefilled Syringe A subcutaneous injection is a shot of medicine that is given into the layer of fat and tissue between skin and muscle. The injection is given with a single-use  syringe that is already filled with medicine. The syringe is called a prefilled syringe. Read the medicine guide or package insert that came with the syringe. Follow directions from the guide about how to prepare and give the injection. This is important because the directions may be different for each medicine. Use only the syringe, needle, and medicine that your health care provider prescribes. Use each prefilled syringe and needle only one time. Supplies needed: Prefilled syringe with needle. Use the needle length and size (gauge) that your provider or pharmacist gives to you. Alcohol wipes. Gauze. Bandage. A container to put used syringes. This may be a sharps container or a hard plastic container that has a secure lid, such as an empty laundry detergent bottle. How to choose a site for injection Follow instructions from your provider about where to give an injection. Do not inject in the same spot each time. There are five main areas that can be used for injecting. These areas include: Abdomen. Avoid the area that is within 2 inches (5 cm) of your navel (umbilicus). Front of thigh. Upper, outer side of thigh. Upper, outer side of arm. Upper, outer part of butt. How to give an injection using a prefilled syringe  Wash your hands with soap and water. If soap and water are not available, use hand sanitizer. Use an alcohol wipe to clean the site where you will be injecting the needle. Let the site air-dry. Remove the plastic cover from the needle on the syringe. Do not let  the needle touch anything. Hold the syringe with the needle pointing up. Check the syringe for any remaining air bubbles. If there are air bubbles, flick the syringe with your finger until the air bubbles rise to the top. Then, gently push on the plunger until you can see a drop of medicine appear at the tip of the needle. This will clear any remaining air bubbles from the syringe. Hold the syringe in your writing hand like  a pencil. Use your other hand to pinch and hold about an inch (2.5 cm) of skin. Do not directly touch the cleaned part of the skin. Gently but quickly, put the needle straight into the skin. The needle should be at a 90-degree angle to the skin. The needle may need to be injected at a 45-degree angle in thin adults or children who have a small amount of body fat. Follow instructions from your provider about the right size needle and angle you should use for the injection. After the needle is completely inserted into the skin, release the skin that you are pinching. Continue to hold the syringe with your writing hand. Use your thumb or index finger of your writing hand to push the plunger all the way into the syringe to inject the medicine. Pull the needle straight out of the skin. If there is bleeding: Press and hold a piece of gauze over the injection site until bleeding stops. Do not rub the area. Cover the injection site with a bandage, if needed. How to safely throw away the supplies If you are using a syringe that does not have a safety system for shielding the needle after injection: Do not recap the needle. Place the syringe and needle in the disposal container. If your syringe has a safety system for shielding the needle after injection: Firmly push down on the plunger after you complete the injection. The protective sleeve will automatically cover the needle, and you will hear a click. The click means that the needle is safely covered. Follow the disposal regulations for the area where you live. Do not use any syringe or needle more than one time. Contact a health care provider if: You have trouble giving the injection. You think that the injection was not given correctly. You have trouble with any of the supplies. The medicine causes side effects. You get a rash on your skin. A get a fever. The condition that is being treated gets worse. Get help right away if: You get any of these  symptoms after the injection is given: Trouble breathing. Chest pain. A rash over most or all of your body. Swelling of the lips or tongue. Trouble swallowing. These symptoms may be an emergency. Get help right away. Call 911. Do not wait to see if the symptoms will go away. Do not drive yourself to the hospital. This information is not intended to replace advice given to you by your health care provider. Make sure you discuss any questions you have with your health care provider. Document Revised: 05/22/2022 Document Reviewed: 05/22/2022 Elsevier Patient Education  2024 [employer].

## 2023-10-28 ENCOUNTER — Ambulatory Visit (HOSPITAL_COMMUNITY)
Admission: RE | Admit: 2023-10-28 | Discharge: 2023-10-28 | Disposition: A | Discharge status: Home / Self Care | Payer: Self-pay | Source: Ambulatory Visit | Attending: Cardiovascular Disease | Admitting: Cardiovascular Disease

## 2023-10-28 DIAGNOSIS — E785 Hyperlipidemia, unspecified: Secondary | ICD-10-CM | POA: Insufficient documentation

## 2023-11-10 ENCOUNTER — Encounter (HOSPITAL_BASED_OUTPATIENT_CLINIC_OR_DEPARTMENT_OTHER): Payer: Self-pay | Admitting: Cardiovascular Disease

## 2023-11-19 ENCOUNTER — Other Ambulatory Visit: Payer: Self-pay | Admitting: Obstetrics and Gynecology

## 2023-11-19 DIAGNOSIS — Z1231 Encounter for screening mammogram for malignant neoplasm of breast: Secondary | ICD-10-CM

## 2023-11-20 ENCOUNTER — Telehealth: Payer: Self-pay | Admitting: Cardiovascular Disease

## 2023-11-20 NOTE — Telephone Encounter (Signed)
 Renetta with [facility] is requesting a copy of 3/03 office visit notes, per Dr. Wendee Copp request.  Phone#: [phone] Fax#: [phone]

## 2023-11-20 NOTE — Telephone Encounter (Signed)
 Printed to be faxed

## 2023-11-30 ENCOUNTER — Telehealth: Payer: Self-pay

## 2023-11-30 NOTE — Telephone Encounter (Signed)
 Called received from [facility] stating that the pt called them to schedule her screening mammo and c/o abn self breast exam. Reported abn tissue in left breast.  Last AEX 10/22/2022-BS Next AEX 12/31/2023-BS  Last mammo 11/19/2022-WNL  OV for breast exam prior to dx imaging? Or if nothing available for OV sooner, can wait until scheduled appt on 5/15? TIA.

## 2023-11-30 NOTE — Telephone Encounter (Signed)
 LDVM on machine per DPR advising the pt to call and make appt at her earliest convenience.   Will leave encounter open to f/u on pt's appt date/time.

## 2023-11-30 NOTE — Telephone Encounter (Signed)
 Patient needs office visit for a breast check prior to ordering diagnostic breast imaging.

## 2023-12-02 ENCOUNTER — Telehealth: Payer: Self-pay

## 2023-12-02 NOTE — Telephone Encounter (Signed)
 Will do breast assessment at office visit to determine imaging needs. This encounter can be closed.

## 2023-12-02 NOTE — Telephone Encounter (Signed)
 Patient scheduled for 4/22 at 2pm for breast exam.

## 2023-12-02 NOTE — Telephone Encounter (Signed)
 Patient states that she called to get her yearly mammogram done and the person asked if she had any problems. She states that she had a little tenderness under her right breast. Patient requesting diagnostic mammogram be sent. Ok to send order?

## 2023-12-03 NOTE — Progress Notes (Signed)
 GYNECOLOGY  VISIT   HPI: 63 y.o.   Divorced  Caucasian female   [phone] with Patient's last menstrual period was 04/24/2012.   here for: breast exam due to abnormal tissue and tenderness felt in left breast.   Felt a hard area of the left breast. Now does not feel a lump, but she has tenderness present.  No nipple discharge.   Playing tennis.  No breast trauma.   Started HRT and testosterone therapy on April 4 through her PCP.  She is using estradiol /testosterone transdermally and progesterone 100 mg nightly through [facility].  She presented with symptoms of fatigue and decreased sleep quality. Her estrogen level was noted to be low.  No prior HRT.  LMP 2013 on chart review here.   Drinking stronger coffee.   Expecting her second grandchild.   GYNECOLOGIC HISTORY: Patient's last menstrual period was 04/24/2012. Contraception: PM Menopausal hormone therapy: none Last 2 paps: 2018-WNL, HPV- neg, 10/22/2022-WNL, HPV- neg History of abnormal Pap or positive HPV: no Mammogram: 11/19/2022-WNL, Cat B        OB History     Gravida  3   Para  2   Term  2   Preterm      AB  1   Living  2      SAB      IAB      Ectopic      Multiple      Live Births                 Patient Active Problem List   Diagnosis Date Noted   Chronic fatigue 06/01/2013   Depression 03/11/2013   EBV infection 11/22/2012   Ovarian cyst     Past Medical History:  Diagnosis Date   Acute pharyngitis    Alcohol dependence (HCC)    Allergic rhinitis    Anxiety    Appendicitis    Asthma    Chondrocostal junction syndrome    Constipation    Cough    COVID-19    Depression    Eating disorder    Fatigue    Gammaherpesviral mononucleosis    Ganglion cyst    GERD (gastroesophageal reflux disease)    HLD (hyperlipidemia)    Hyperlipidemia    Hypothyroidism    Insect bite    Insomnia    Menopausal and female climacteric states    Mood disorder (HCC)    Osteopenia 2024    Otalgia, left ear    Other specified abnormal findings of blood chemistry    Ovarian cyst    Streptococcal infection    URI (upper respiratory infection)    Viral infection     Past Surgical History:  Procedure Laterality Date   APPENDECTOMY     PELVIC LAPAROSCOPY      Current Outpatient Medications  Medication Sig Dispense Refill   Amphetamine-Dextroamphetamine (ADDERALL PO) Take 15 mg by mouth.      b complex vitamins capsule Take 1 capsule by mouth daily.     Cyanocobalamin (B-12 PO) Take by mouth daily.     Evolocumab  (REPATHA  SURECLICK) 140 MG/ML SOAJ Inject 140 mg into the skin every 14 (fourteen) days. 6 mL 3   ezetimibe (ZETIA) 10 MG tablet Take 10 mg by mouth daily.     Fish Oil-Cholecalciferol (OMEGA-3 FISH OIL-VITAMIN D3 PO) Take by mouth.     FLUoxetine (PROZAC) 40 MG capsule Take 80 mg by mouth daily.     lamoTRIgine (LAMICTAL) 25  MG tablet Take 2 tablets by mouth daily.  0   levothyroxine (SYNTHROID) 25 MCG tablet Take 25 mcg by mouth daily.     Magnesium 400 MG CAPS Take 800 mg by mouth daily.     Multiple Vitamins-Minerals (MULTIVITAMIN ADULTS PO) Take by mouth.     NONFORMULARY OR COMPOUNDED ITEM Estradiol  and testosterone transdermal.     progesterone (PROMETRIUM) 100 MG capsule Take 100 mg by mouth daily.     TRAZODONE HCL PO Take 50 mg by mouth at bedtime as needed.      No current facility-administered medications for this visit.     ALLERGIES: Patient has no known allergies.  Family History  Problem Relation Age of Onset   Diabetes Mother    Hypertension Mother    Transient ischemic attack Mother    Melanoma Father    Heart attack Sister    Heart attack Brother        Age 64   Pancreatitis Brother    Pancreatitis Brother    Breast cancer Paternal Aunt        age 8's   Breast cancer Paternal Aunt        Age 58's    Social History   Socioeconomic History   Marital status: Divorced    Spouse name: Not on file   Number of children: Not  on file   Years of education: Not on file   Highest education level: Not on file  Occupational History   Not on file  Tobacco Use   Smoking status: Former   Smokeless tobacco: Never  Vaping Use   Vaping status: Never Used  Substance and Sexual Activity   Alcohol use: No   Drug use: No   Sexual activity: Yes    Birth control/protection: Post-menopausal  Other Topics Concern   Not on file  Social History Narrative   Not on file   Social Drivers of Health   Financial Resource Strain: Not on file  Food Insecurity: Not on file  Transportation Needs: Not on file  Physical Activity: Not on file  Stress: Not on file  Social Connections: Not on file  Intimate Partner Violence: Unknown (01/08/2022)   [facility]   Intimate Partner Violence    Fear of Current or Ex-Partner: Not on file    Review of Systems  See HPI.   PHYSICAL EXAMINATION:   BP 110/68 (BP Location: Left Arm, Patient Position: Sitting, Cuff Size: Normal)   Pulse 84   Ht 5' 7.75" (1.721 m)   Wt 124 lb (56.2 kg)   LMP 04/24/2012   SpO2 96%   BMI 18.99 kg/m     General appearance: alert, cooperative and appears stated age Neck: no adenopathy. Lungs: clear to auscultation bilaterally Breasts: right - normal appearance, no masses or tenderness, No nipple retraction or dimpling, No nipple discharge or bleeding, No axillary or supraclavicular adenopathy Left - normal appearance, no mass, tenderness present at 7:00, No nipple retraction or dimpling, No nipple discharge or bleeding, No axillary or supraclavicular adenopathy Chaperone was present for exam:  Cottie Diss, CMA  ASSESSMENT:  Left breast tenderness at 7:00.  No mass.   Recent initial of estrogen, progesterone, and testosterone therapy.  FH breast cancer in two paternal aunts.   PLAN:  Discused [facility]I and use of HRT 10 years post menopause and over the age of 60 which can increase risk of PE, DVT, MI, stroke and breast cancer.  She  will  stop her HRT.  Will plan for a dx bilateral mammogram and left breast ultrasound at [facility].  Fu for annual exam and prn.   30 min  total time was spent for this patient encounter, including preparation, face-to-face counseling with the patient, coordination of care, and documentation of the encounter.

## 2023-12-08 ENCOUNTER — Other Ambulatory Visit: Payer: Self-pay

## 2023-12-08 ENCOUNTER — Telehealth: Payer: Self-pay | Admitting: Obstetrics and Gynecology

## 2023-12-08 ENCOUNTER — Ambulatory Visit: Payer: Self-pay | Admitting: Obstetrics and Gynecology

## 2023-12-08 ENCOUNTER — Encounter: Payer: Self-pay | Admitting: Obstetrics and Gynecology

## 2023-12-08 VITALS — BP 110/68 | HR 84 | Ht 67.75 in | Wt 124.0 lb

## 2023-12-08 DIAGNOSIS — Z79890 Hormone replacement therapy (postmenopausal): Secondary | ICD-10-CM | POA: Diagnosis not present

## 2023-12-08 DIAGNOSIS — N644 Mastodynia: Secondary | ICD-10-CM | POA: Diagnosis not present

## 2023-12-08 NOTE — Telephone Encounter (Signed)
 [facility]   Dx is left breast pain at 7:00. No mass noted.

## 2023-12-08 NOTE — Telephone Encounter (Signed)
 Orders placed.

## 2023-12-10 NOTE — Telephone Encounter (Signed)
 Per imaging ctr appt notes: "12/09/23-1ST ATTEMPT LVM-DL"

## 2023-12-18 NOTE — Telephone Encounter (Signed)
 Pt scheduled for breast imaging on 01/06/2024.

## 2023-12-31 ENCOUNTER — Encounter: Payer: Self-pay | Admitting: Obstetrics and Gynecology

## 2023-12-31 ENCOUNTER — Other Ambulatory Visit (HOSPITAL_COMMUNITY)
Admission: RE | Admit: 2023-12-31 | Discharge: 2023-12-31 | Disposition: A | Discharge status: Home / Self Care | Source: Ambulatory Visit | Attending: Obstetrics and Gynecology | Admitting: Obstetrics and Gynecology

## 2023-12-31 ENCOUNTER — Encounter

## 2023-12-31 ENCOUNTER — Other Ambulatory Visit

## 2023-12-31 ENCOUNTER — Ambulatory Visit (INDEPENDENT_AMBULATORY_CARE_PROVIDER_SITE_OTHER): Payer: Self-pay | Admitting: Obstetrics and Gynecology

## 2023-12-31 VITALS — BP 108/64 | HR 79 | Ht 67.75 in | Wt 127.0 lb

## 2023-12-31 DIAGNOSIS — N760 Acute vaginitis: Secondary | ICD-10-CM | POA: Insufficient documentation

## 2023-12-31 DIAGNOSIS — Z1331 Encounter for screening for depression: Secondary | ICD-10-CM

## 2023-12-31 DIAGNOSIS — M8589 Other specified disorders of bone density and structure, multiple sites: Secondary | ICD-10-CM

## 2023-12-31 DIAGNOSIS — Z1159 Encounter for screening for other viral diseases: Secondary | ICD-10-CM

## 2023-12-31 DIAGNOSIS — Z113 Encounter for screening for infections with a predominantly sexual mode of transmission: Secondary | ICD-10-CM

## 2023-12-31 DIAGNOSIS — Z01419 Encounter for gynecological examination (general) (routine) without abnormal findings: Secondary | ICD-10-CM

## 2023-12-31 DIAGNOSIS — Z114 Encounter for screening for human immunodeficiency virus [HIV]: Secondary | ICD-10-CM

## 2023-12-31 MED ORDER — ESTRADIOL 10 MCG VA TABS: 1.0000 | ORAL_TABLET | VAGINAL | 3 refills | Status: AC

## 2023-12-31 NOTE — Patient Instructions (Signed)

## 2023-12-31 NOTE — Progress Notes (Signed)
 63 y.o. G64P2012 Divorced Caucasian female here for annual exam. Has been having slight itching today, thinks she may have yeast infection. No discharge or odor.   Seen for left breast pain on 12/08/23.  She has a dx mammogram and US  on 01/18/24.   She had recently started HRT/testosterone through PCP.  She chose to stop after the office visit here in April.  Has vaginal dryness.  No partner change.  She desires STD screening.   Sees dermatology yearly.  PCP: Allene Ivan, MD   Patient's last menstrual period was 04/24/2012.           Sexually active: Yes.    The current method of family planning is post menopausal status.    Menopausal hormone therapy:  Estradiol  and testosterone transdermal  Exercising: Yes.    Tennis, running, water aerobics, yoga Smoker:  Former  OB History  Gravida Para Term Preterm AB Living  3 2 2  1 2   SAB IAB Ectopic Multiple Live Births          # Outcome Date GA Lbr Len/2nd Weight Sex Type Anes PTL Lv  3 AB           2 Term           1 Term              HEALTH MAINTENANCE: Last 2 paps:  10/22/22 neg HR HPV neg, 11/11/11 neg  History of abnormal Pap or positive HPV:  no Mammogram:   11/19/22 Breast density Cat B, BIRADS cat 1 neg  Colonoscopy:  2022 Bone Density:  07/06/23  Result:  osteopenia of hip and spine; FRAX 8.2%/1.1%.   Immunization History  Administered Date(s) Administered   Fluzone Influenza virus vaccine,trivalent (IIV3), split virus 05/18/2013   Influenza,inj,Quad PF,6+ Mos 06/01/2013   Influenza,inj,quad, With Preservative 08/19/2015   Tdap 08/02/2018      reports that she has quit smoking. She has never used smokeless tobacco. She reports that she does not drink alcohol and does not use drugs.  Past Medical History:  Diagnosis Date   Acute pharyngitis    Alcohol dependence (HCC)    Allergic rhinitis    Anxiety    Appendicitis    Asthma    Chondrocostal junction syndrome    Constipation    Cough    COVID-19     Depression    Eating disorder    Fatigue    Gammaherpesviral mononucleosis    Ganglion cyst    GERD (gastroesophageal reflux disease)    HLD (hyperlipidemia)    Hyperlipidemia    Hypothyroidism    Insect bite    Insomnia    Menopausal and female climacteric states    Mood disorder (HCC)    Osteopenia 2024   Otalgia, left ear    Other specified abnormal findings of blood chemistry    Ovarian cyst    Streptococcal infection    URI (upper respiratory infection)    Viral infection     Past Surgical History:  Procedure Laterality Date   APPENDECTOMY     PELVIC LAPAROSCOPY      Current Outpatient Medications  Medication Sig Dispense Refill   Amphetamine-Dextroamphetamine (ADDERALL PO) Take 15 mg by mouth.      b complex vitamins capsule Take 1 capsule by mouth daily.     Cyanocobalamin (B-12 PO) Take by mouth daily.     Evolocumab  (REPATHA  SURECLICK) 140 MG/ML SOAJ Inject 140 mg into the skin every 14 (  fourteen) days. 6 mL 3   ezetimibe (ZETIA) 10 MG tablet Take 10 mg by mouth daily.     Fish Oil-Cholecalciferol (OMEGA-3 FISH OIL-VITAMIN D3 PO) Take by mouth.     FLUoxetine (PROZAC) 40 MG capsule Take 80 mg by mouth daily.     lamoTRIgine (LAMICTAL) 25 MG tablet Take 2 tablets by mouth daily.  0   levothyroxine (SYNTHROID) 25 MCG tablet Take 25 mcg by mouth daily.     Magnesium 400 MG CAPS Take 800 mg by mouth daily.     Multiple Vitamins-Minerals (MULTIVITAMIN ADULTS PO) Take by mouth.     NONFORMULARY OR COMPOUNDED ITEM Estradiol  and testosterone transdermal.     progesterone (PROMETRIUM) 100 MG capsule Take 100 mg by mouth daily.     TRAZODONE HCL PO Take 50 mg by mouth at bedtime as needed.      No current facility-administered medications for this visit.    ALLERGIES: Patient has no known allergies.  Family History  Problem Relation Age of Onset   Diabetes Mother    Hypertension Mother    Transient ischemic attack Mother    Melanoma Father    Heart attack  Sister    Heart attack Brother        Age 1   Pancreatitis Brother    Pancreatitis Brother    Breast cancer Paternal Aunt        age 44's   Breast cancer Paternal Aunt        Age 45's    Review of Systems  All other systems reviewed and are negative.   PHYSICAL EXAM:  BP 108/64 (BP Location: Left Arm, Patient Position: Sitting)   Pulse 79   Ht 5' 7.75" (1.721 m)   Wt 127 lb (57.6 kg)   LMP 04/24/2012   SpO2 98%   BMI 19.45 kg/m     General appearance: alert, cooperative and appears stated age Head: normocephalic, without obvious abnormality, atraumatic Neck: no adenopathy, supple, symmetrical, trachea midline and thyroid normal to inspection and palpation Lungs: clear to auscultation bilaterally Breasts: normal appearance, no masses or tenderness, No nipple retraction or dimpling, No nipple discharge or bleeding, No axillary adenopathy Heart: regular rate and rhythm Abdomen: soft, non-tender; no masses, no organomegaly Extremities: extremities normal, atraumatic, no cyanosis or edema Skin: skin color, texture, turgor normal. No rashes or lesions Lymph nodes: cervical, supraclavicular, and axillary nodes normal. Neurologic: grossly normal  Pelvic: External genitalia:  no lesions              No abnormal inguinal nodes palpated.              Urethra:  normal appearing urethra with no masses, tenderness or lesions              Bartholins and Skenes: normal                 Vagina: normal appearing vagina with normal color and discharge, no lesions              Cervix: no lesions              Pap taken: no Bimanual Exam:  Uterus:  normal size, contour, position, consistency, mobility, non-tender              Adnexa: no mass, fullness, tenderness              Rectal exam: yes.  Confirms.  Anus:  normal sphincter tone, no lesions  Chaperone was present for exam:  Cottie Diss, CMA  ASSESSMENT: Well woman visit with gynecologic exam. STD screening.  Vaginitis.   Vaginal atrophy.  Osteopenia.   PHQ-9: 0  PLAN: She will proceed forward she her dx breast imaging.  Self breast awareness reviewed. Pap and HRV collected:  no.  Due in 2029.  Guidelines for Calcium, Vitamin D, regular exercise program including cardiovascular and weight bearing exercise. Medication refills:  Vagifem .  Instructed in used.  I discussed potential effect on breast cancer.  STD screening.  Vaginitis testing from [facility].  BMD reviewed.  No prescription medication indicated at this time.  BMD 06/2025.  Follow up:  1 year and prn.

## 2024-01-01 LAB — HIV ANTIBODY (ROUTINE TESTING W REFLEX): HIV 1&2 Ab, 4th Generation: NONREACTIVE

## 2024-01-01 LAB — CERVICOVAGINAL ANCILLARY ONLY
Chlamydia: NEGATIVE
Comment: NEGATIVE
Comment: NEGATIVE
Comment: NORMAL
Neisseria Gonorrhea: NEGATIVE
Trichomonas: NEGATIVE

## 2024-01-01 LAB — HEPATITIS C ANTIBODY: Hepatitis C Ab: NONREACTIVE

## 2024-01-01 LAB — RPR: RPR Ser Ql: NONREACTIVE

## 2024-01-04 ENCOUNTER — Ambulatory Visit: Payer: Self-pay | Admitting: Obstetrics and Gynecology

## 2024-01-06 ENCOUNTER — Encounter

## 2024-01-06 ENCOUNTER — Other Ambulatory Visit

## 2024-01-18 ENCOUNTER — Ambulatory Visit: Payer: Self-pay | Admitting: Obstetrics and Gynecology

## 2024-01-18 ENCOUNTER — Ambulatory Visit
Admission: RE | Admit: 2024-01-18 | Discharge: 2024-01-18 | Disposition: A | Discharge status: Home / Self Care | Source: Ambulatory Visit | Attending: Obstetrics and Gynecology | Admitting: Obstetrics and Gynecology

## 2024-01-18 DIAGNOSIS — N644 Mastodynia: Secondary | ICD-10-CM

## 2024-01-19 ENCOUNTER — Ambulatory Visit (HOSPITAL_BASED_OUTPATIENT_CLINIC_OR_DEPARTMENT_OTHER): Admitting: Nurse Practitioner

## 2024-04-25 ENCOUNTER — Ambulatory Visit (HOSPITAL_BASED_OUTPATIENT_CLINIC_OR_DEPARTMENT_OTHER): Admitting: Nurse Practitioner

## 2024-07-18 ENCOUNTER — Encounter (HOSPITAL_COMMUNITY): Payer: Self-pay | Admitting: Emergency Medicine

## 2024-07-18 ENCOUNTER — Other Ambulatory Visit: Payer: Self-pay

## 2024-07-18 ENCOUNTER — Ambulatory Visit (HOSPITAL_COMMUNITY)
Admission: EM | Admit: 2024-07-18 | Discharge: 2024-07-18 | Disposition: A | Discharge status: Home / Self Care | Attending: Nurse Practitioner | Admitting: Nurse Practitioner

## 2024-07-18 ENCOUNTER — Ambulatory Visit (INDEPENDENT_AMBULATORY_CARE_PROVIDER_SITE_OTHER)

## 2024-07-18 DIAGNOSIS — J069 Acute upper respiratory infection, unspecified: Secondary | ICD-10-CM

## 2024-07-18 DIAGNOSIS — J3489 Other specified disorders of nose and nasal sinuses: Secondary | ICD-10-CM | POA: Diagnosis not present

## 2024-07-18 DIAGNOSIS — R051 Acute cough: Secondary | ICD-10-CM

## 2024-07-18 MED ORDER — BENZONATATE 100 MG PO CAPS: 100.0000 mg | ORAL_CAPSULE | Freq: Three times a day (TID) | ORAL | 0 refills | Status: DC | PRN

## 2024-07-18 MED ORDER — AMOXICILLIN-POT CLAVULANATE 875-125 MG PO TABS: 1.0000 | ORAL_TABLET | Freq: Two times a day (BID) | ORAL | 0 refills | Status: AC

## 2024-07-18 NOTE — ED Notes (Signed)
 Pt does not appear in any distress, O2 at 99 in lobby

## 2024-07-18 NOTE — ED Provider Notes (Signed)
 [facility]    CSN: [id number] Arrival date & time: 07/18/24  9140      History   Chief Complaint Chief Complaint  Patient presents with   Shortness of Breath    HPI Joan Marshall is a 63 y.o. female.   Patient presents today for symptoms that began on 07/06/2024, over 2 weeks ago.  She reports initially, she had bodyaches, fever symptoms, and sore throat.  Reports symptoms have progressed, she has developed a congested cough, fatigue, low energy, and coughing making it difficult to sleep at night.  She has been taking Delsym for the past few days and developed lightheadedness.  Reports her boyfriend sick with similar symptoms.  No fever, body aches, or chills recently.  No significant sinus pressure, headache, ear pain, chest pain, shortness of breath.     Past Medical History:  Diagnosis Date   Acute pharyngitis    Alcohol dependence (HCC)    Allergic rhinitis    Anxiety    Appendicitis    Asthma    Chondrocostal junction syndrome    Constipation    Cough    COVID-19    Depression    Eating disorder    Fatigue    Gammaherpesviral mononucleosis    Ganglion cyst    GERD (gastroesophageal reflux disease)    HLD (hyperlipidemia)    Hyperlipidemia    Hypothyroidism    Insect bite    Insomnia    Menopausal and female climacteric states    Mood disorder    Osteopenia 2024   Otalgia, left ear    Other specified abnormal findings of blood chemistry    Ovarian cyst    Streptococcal infection    URI (upper respiratory infection)    Viral infection     Patient Active Problem List   Diagnosis Date Noted   Chronic fatigue 06/01/2013   Depression 03/11/2013   EBV infection 11/22/2012   Ovarian cyst     Past Surgical History:  Procedure Laterality Date   APPENDECTOMY     PELVIC LAPAROSCOPY      OB History     Gravida  3   Para  2   Term  2   Preterm      AB  1   Living  2      SAB      IAB      Ectopic      Multiple      Live  Births               Home Medications    Prior to Admission medications   Medication Sig Start Date End Date Taking? Authorizing Provider  amoxicillin -clavulanate (AUGMENTIN ) 875-125 MG tablet Take 1 tablet by mouth 2 (two) times daily for 7 days. 07/18/24 07/25/24 Yes Chandra Raisin A, NP  Amphetamine-Dextroamphetamine (ADDERALL PO) Take 15 mg by mouth.    Yes [provider]  benzonatate  (TESSALON ) 100 MG capsule Take 1 capsule (100 mg total) by mouth 3 (three) times daily as needed for cough. Do not take with alcohol or while operating or driving heavy machinery 87/8/74  Yes Chandra Raisin A, NP  b complex vitamins capsule Take 1 capsule by mouth daily.    [provider]  Cyanocobalamin (B-12 PO) Take by mouth daily.    [provider]  Estradiol  (VAGIFEM ) 10 MCG TABS vaginal tablet Place 1 tablet (10 mcg total) vaginally 2 (two) times a week. Use every night before bed for two weeks  when you first begin this medicine, then after the first two weeks, begin using it twice a week. 12/31/23   Cathlyn JAYSON Nikki Bobie FORBES, MD  Evolocumab  (REPATHA  SURECLICK) 140 MG/ML SOAJ Inject 140 mg into the skin every 14 (fourteen) days. 10/19/23   Raford Riggs, MD  ezetimibe (ZETIA) 10 MG tablet Take 10 mg by mouth daily.    [provider]  Fish Oil-Cholecalciferol (OMEGA-3 FISH OIL-VITAMIN D3 PO) Take by mouth.    [provider]  FLUoxetine (PROZAC) 40 MG capsule Take 80 mg by mouth daily.    [provider]  lamoTRIgine (LAMICTAL) 25 MG tablet Take 2 tablets by mouth daily. 01/21/18   [provider]  levothyroxine (SYNTHROID) 25 MCG tablet Take 25 mcg by mouth daily. 09/11/22   [provider]  Magnesium 400 MG CAPS Take 800 mg by mouth daily.    [provider]  Multiple Vitamins-Minerals (MULTIVITAMIN ADULTS PO) Take by mouth.    [provider]  TRAZODONE HCL PO Take 50 mg by mouth at bedtime as needed.      [provider]    Family History Family History  Problem Relation Age of Onset   Diabetes Mother    Hypertension Mother    Transient ischemic attack Mother    Melanoma Father    Heart attack Sister    Heart attack Brother        Age 44   Pancreatitis Brother    Pancreatitis Brother    Breast cancer Paternal Aunt        age 32's   Breast cancer Paternal Aunt        Age 27's    Social History Social History   Tobacco Use   Smoking status: Former   Smokeless tobacco: Never  [employer]   Vaping status: Never Used  Substance Use Topics   Alcohol use: No   Drug use: No     Allergies   Patient has no known allergies.   Review of Systems Review of Systems Per HPI  Physical Exam Triage Vital Signs ED Triage Vitals  Encounter Vitals Group     BP 07/18/24 0957 114/65     Girls Systolic BP Percentile --      Girls Diastolic BP Percentile --      Boys Systolic BP Percentile --      Boys Diastolic BP Percentile --      Pulse Rate 07/18/24 0907 68     Resp 07/18/24 0957 18     Temp 07/18/24 0957 98.2 F (36.8 C)     Temp Source 07/18/24 0957 Oral     SpO2 07/18/24 0907 99 %     Weight --      Height --      Head Circumference --      Peak Flow --      Pain Score 07/18/24 0953 0     Pain Loc --      Pain Education --      Exclude from Growth Chart --    No data found.  Updated Vital Signs BP 114/65 (BP Location: Left Arm)   Pulse 72   Temp 98.2 F (36.8 C) (Oral)   Resp 18   LMP 04/24/2012   SpO2 98%   Visual Acuity Right Eye Distance:   Left Eye Distance:   Bilateral Distance:    Right Eye Near:   Left Eye Near:    Bilateral Near:  Physical Exam Vitals and nursing note reviewed.  Constitutional:      General: She is not in acute distress.    Appearance: Normal appearance. She is not ill-appearing or toxic-appearing.  HENT:     Head: Normocephalic and atraumatic.     Right Ear: Tympanic membrane, ear canal and external ear  normal.     Left Ear: Tympanic membrane, ear canal and external ear normal.     Nose: No congestion or rhinorrhea.     Mouth/Throat:     Mouth: Mucous membranes are moist.     Pharynx: Oropharynx is clear. No oropharyngeal exudate or posterior oropharyngeal erythema.  Eyes:     General: No scleral icterus.    Extraocular Movements: Extraocular movements intact.  Cardiovascular:     Rate and Rhythm: Normal rate and regular rhythm.  Pulmonary:     Effort: Pulmonary effort is normal. No respiratory distress.     Breath sounds: Normal breath sounds. No wheezing, rhonchi or rales.  Musculoskeletal:     Cervical back: Normal range of motion and neck supple.  Lymphadenopathy:     Cervical: No cervical adenopathy.  Skin:    General: Skin is warm and dry.     Coloration: Skin is not jaundiced or pale.     Findings: No erythema or rash.  Neurological:     Mental Status: She is alert and oriented to person, place, and time.  Psychiatric:        Behavior: Behavior is cooperative.      UC Treatments / Results  Labs (all labs ordered are listed, but only abnormal results are displayed) Labs Reviewed - No data to display  EKG   Radiology DG Chest 2 View Result Date: 07/18/2024 EXAM: 2 VIEW(S) XRAY OF THE CHEST 07/18/2024 10:32:27 AM COMPARISON: None available. CLINICAL HISTORY: cough FINDINGS: LUNGS AND PLEURA: Hyperinflation. No focal pulmonary opacity. No pleural effusion. No pneumothorax. HEART AND MEDIASTINUM: No acute abnormality of the cardiac and mediastinal silhouettes. BONES AND SOFT TISSUES: No acute osseous abnormality. IMPRESSION: 1. Hyperinflation, which can be seen with reactive airways disease or bronchitis in the setting of cough. Electronically signed by: Waddell Calk MD 07/18/2024 11:15 AM EST RP Workstation: [id number]    Procedures Procedures (including critical care time)  Medications Ordered in UC Medications - No data to display  Initial Impression /  Assessment and Plan / UC Course  I have reviewed the triage vital signs and the nursing notes.  Pertinent labs & imaging results that were available during my care of the patient were reviewed by me and considered in my medical decision making (see chart for details).   Patient is a very pleasant, well-appearing, 63 year old female presenting today for viral symptoms.  On exam, lungs are clear to auscultation bilaterally and she does not have any signs of bacterial infection.  Chest x-ray today shows hyperinflation, otherwise negative for acute cardiopulmonary process.  Given sinus pressure with palpation that recently developed along with lightheadedness, suspicion for possibly developing bacterial sinus infection.  Otherwise, symptoms are consistent with postviral cough and we discussed use of cough suppressant medication.  If symptoms worsen or do not improve with treatment, start Augmentin to treat for bacterial sinusitis.  Return and ER precautions discussed.  The patient was given the opportunity to ask questions.  All questions answered to their satisfaction.  The patient is in agreement to this plan.   Final Clinical Impressions(s) / UC Diagnoses   Final diagnoses:  Acute cough  Viral URI  with cough  Sinus pressure     Discharge Instructions      You have a viral upper respiratory infection.  Symptoms should improve over the next few days.  If you develop chest pain or shortness of breath, go to the emergency room.  I will contact you later today with results of the chest x-ray if it results in a change in treatment plan.  Some things that can make you feel better are: - Increased rest - Increasing fluid with water/sugar free electrolytes - Acetaminophen and ibuprofen as needed for fever/pain - Salt water gargling, chloraseptic spray and throat lozenges - OTC guaifenesin (Mucinex) 600 mg twice daily - Saline sinus flushes or a neti pot - Humidifying the air -Tessalon Perles  every 8 hours as needed for dry cough   If symptoms do not improve over the next week, sinus pressure worsens, you develop thick nasal congestion, or severe headache, take the Augmentin as prescribed to treat for a sinus infection.    ED Prescriptions     Medication Sig Dispense Auth. Provider   benzonatate (TESSALON) 100 MG capsule Take 1 capsule (100 mg total) by mouth 3 (three) times daily as needed for cough. Do not take with alcohol or while operating or driving heavy machinery 21 capsule Chandra Raisin A, NP   amoxicillin-clavulanate (AUGMENTIN) 875-125 MG tablet Take 1 tablet by mouth 2 (two) times daily for 7 days. 14 tablet Chandra Raisin LABOR, NP      PDMP not reviewed this encounter.   Chandra Raisin LABOR, NP 07/18/24 [phone]

## 2024-07-18 NOTE — ED Triage Notes (Signed)
 11/19 had a sore throat, then had body aches, covid tests negative.  Fatigue, low energy, then back ache, and now feels congestion in chest, coughing all night making it difficult to sleep.  Today has felt light headed, no production of phlegm with cough, denies fever.   Has taken delsym  Boyfriend has same symptoms

## 2024-07-18 NOTE — Discharge Instructions (Addendum)
 You have a viral upper respiratory infection.  Symptoms should improve over the next few days.  If you develop chest pain or shortness of breath, go to the emergency room.  I will contact you later today with results of the chest x-ray if it results in a change in treatment plan.  Some things that can make you feel better are: - Increased rest - Increasing fluid with water/sugar free electrolytes - Acetaminophen and ibuprofen as needed for fever/pain - Salt water gargling, chloraseptic spray and throat lozenges - OTC guaifenesin (Mucinex) 600 mg twice daily - Saline sinus flushes or a neti pot - Humidifying the air -Tessalon Perles every 8 hours as needed for dry cough   If symptoms do not improve over the next week, sinus pressure worsens, you develop thick nasal congestion, or severe headache, take the Augmentin as prescribed to treat for a sinus infection.

## 2024-08-15 NOTE — Progress Notes (Unsigned)
 "  63 y.o. [name] female here for problem visit. Divorced.  Patient's last menstrual period was 04/24/2012.   She reports strong urge to pee, burning sensation inside vagina, terrible itching in and outside of vagina. Sx started 12/17. Rash and ulcer followed. 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  Birth control: Postmenopausal Sexually active: yes, boyfriend x16yr     OB History  Gravida Para Term Preterm AB Living  3 2 2  1 2   SAB IAB Ectopic Multiple Live Births          # Outcome Date GA Lbr Len/2nd Weight Sex Type Anes PTL Lv  3 AB           2 Term           1 Term            Past Medical History:  Diagnosis Date   Acute pharyngitis    Alcohol dependence (HCC)    Allergic rhinitis    Anxiety    Appendicitis    Asthma    Chondrocostal junction syndrome    Constipation    Cough    COVID-19    Depression    Eating disorder    Fatigue    Gammaherpesviral mononucleosis    Ganglion cyst    GERD (gastroesophageal reflux disease)    HLD (hyperlipidemia)    Hyperlipidemia    Hypothyroidism    Insect bite    Insomnia    Menopausal and female climacteric states    Mood disorder    Osteopenia 2024   Otalgia, left ear    Other specified abnormal findings of blood chemistry    Ovarian cyst    Streptococcal infection    URI (upper respiratory infection)    Viral infection    Past Surgical History:  Procedure Laterality Date   APPENDECTOMY     PELVIC LAPAROSCOPY     Medications Ordered Prior to Encounter[1] Allergies[2]    PE Today's Vitals   08/16/24 1146  BP: 112/76  Pulse: 81  Temp: 98 F (36.7 C)  TempSrc: Oral  SpO2: 97%  Weight: 125 lb (56.7 kg)  Height: 5' 7 (1.702 m)   Body mass index is 19.58 kg/m.  Physical Exam Vitals reviewed. Exam conducted with a chaperone present.  Constitutional:      General: She is not in acute distress.    Appearance: Normal appearance.   HENT:     Head: Normocephalic and atraumatic.     Nose: Nose normal.  Eyes:     Extraocular Movements: Extraocular movements intact.     Conjunctiva/sclera: Conjunctivae normal.  Pulmonary:     Effort: Pulmonary effort is normal.  Genitourinary:    General: Normal vulva.     Exam position: Lithotomy position.     Vagina: Normal. No vaginal discharge.     Cervix: Normal. No cervical motion tenderness, discharge or lesion.     Uterus: Normal. Not enlarged and not tender.      Adnexa: Right adnexa normal and left adnexa normal.     Comments: Clusters of ulcers along L lower labia, also along upper labia Musculoskeletal:        General: Normal range of motion.     Cervical back: Normal range of motion.  Neurological:     General: No focal deficit present.     Mental Status: She is alert.  Psychiatric:  Mood and Affect: Mood normal.        Behavior: Behavior normal.      Assessment and Plan:        Urinary frequency -     Urinalysis,Complete w/RFL Culture  Vulval lesion -     HSV(herpes simplex vrs) 1+2 ab-IgG -     Lidocaine ; Apply 1 Application topically 4 (four) times daily as needed.  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      b complex vitamins capsule Take 1 capsule by mouth daily.     Cyanocobalamin (B-12 PO) Take by mouth daily.     Fish Oil-Cholecalciferol (OMEGA-3 FISH OIL-VITAMIN D3 PO) Take by mouth.     FLUoxetine (PROZAC) 40 MG capsule Take 80 mg by mouth daily.     lamoTRIgine (LAMICTAL) 25 MG tablet Take 2 tablets by mouth daily.  0   levothyroxine (SYNTHROID)  25 MCG tablet Take 25 mcg by mouth daily.     Magnesium 400 MG CAPS Take 800 mg by mouth daily.     Multiple Vitamins-Minerals (MULTIVITAMIN ADULTS PO) Take by mouth.     TRAZODONE HCL PO Take 50 mg by mouth at bedtime as needed.      benzonatate  (TESSALON ) 100 MG capsule Take 1 capsule (100 mg total) by mouth 3 (three) times daily as needed for cough. Do not take with alcohol or while operating or driving heavy machinery (Patient not taking: Reported on 08/16/2024) 21 capsule 0   Estradiol  (VAGIFEM ) 10 MCG TABS vaginal tablet Place 1 tablet (10 mcg total) vaginally 2 (two) times a week. Use every night before bed for two weeks when you first begin this medicine, then after the first two weeks, begin using it twice a week. (Patient not taking: Reported on 08/16/2024) 34 tablet 3   Evolocumab  (REPATHA  SURECLICK) 140 MG/ML SOAJ Inject 140 mg into the skin every 14 (fourteen) days. (Patient not taking: Reported on 08/16/2024) 6 mL 3   ezetimibe (ZETIA) 10 MG tablet Take 10 mg by mouth daily. (Patient not taking: Reported on 08/16/2024)     No current facility-administered medications on file prior to visit.  [2] No Known Allergies  "

## 2024-08-16 ENCOUNTER — Ambulatory Visit: Admitting: Obstetrics and Gynecology

## 2024-08-16 ENCOUNTER — Encounter: Payer: Self-pay | Admitting: Obstetrics and Gynecology

## 2024-08-16 ENCOUNTER — Ambulatory Visit (HOSPITAL_BASED_OUTPATIENT_CLINIC_OR_DEPARTMENT_OTHER): Admitting: Nurse Practitioner

## 2024-08-16 ENCOUNTER — Other Ambulatory Visit: Payer: Self-pay | Admitting: Obstetrics and Gynecology

## 2024-08-16 VITALS — BP 112/76 | HR 81 | Temp 98.0°F | Ht 67.0 in | Wt 125.0 lb

## 2024-08-16 DIAGNOSIS — N9089 Other specified noninflammatory disorders of vulva and perineum: Secondary | ICD-10-CM

## 2024-08-16 DIAGNOSIS — Z113 Encounter for screening for infections with a predominantly sexual mode of transmission: Secondary | ICD-10-CM | POA: Diagnosis not present

## 2024-08-16 DIAGNOSIS — R350 Frequency of micturition: Secondary | ICD-10-CM

## 2024-08-16 MED ORDER — LIDOCAINE 5 % EX OINT: 1.0000 | TOPICAL_OINTMENT | Freq: Four times a day (QID) | CUTANEOUS | 1 refills | Status: AC | PRN

## 2024-08-16 NOTE — Addendum Note (Signed)
 Addended by: DALLIE BOLLARD V on: 08/16/2024 01:51 PM   Modules accepted: Orders

## 2024-08-18 LAB — URINE CULTURE
MICRO NUMBER:: 17409856
Result:: NO GROWTH
SPECIMEN QUALITY:: ADEQUATE

## 2024-08-18 LAB — URINALYSIS, COMPLETE W/RFL CULTURE
Bacteria, UA: NONE SEEN /HPF
Bilirubin Urine: NEGATIVE
Glucose, UA: NEGATIVE
Hyaline Cast: NONE SEEN /LPF
Ketones, ur: NEGATIVE
Leukocyte Esterase: NEGATIVE
Nitrites, Initial: NEGATIVE
Protein, ur: NEGATIVE
Specific Gravity, Urine: 1.01 (ref 1.001–1.035)
pH: 6.5 (ref 5.0–8.0)

## 2024-08-18 LAB — SURESWAB® ADVANCED VAGINITIS PLUS,TMA
C. trachomatis RNA, TMA: NOT DETECTED
CANDIDA SPECIES: NOT DETECTED
Candida glabrata: NOT DETECTED
N. gonorrhoeae RNA, TMA: NOT DETECTED
SURESWAB(R) ADV BACTERIAL VAGINOSIS(BV),TMA: NEGATIVE
TRICHOMONAS VAGINALIS (TV),TMA: NOT DETECTED

## 2024-08-18 LAB — CULTURE INDICATED

## 2024-08-19 ENCOUNTER — Ambulatory Visit: Payer: Self-pay | Admitting: Obstetrics and Gynecology

## 2024-08-21 LAB — HERPES SIMPLEX VIRUS 1 AND 2 (IGG),REFLEX HSV-2 INHIBITION
HSV 1 IGG,TYPE SPECIFIC AB: <0.9 {index}
HSV 2 IGG,TYPE SPECIFIC AB: 1.64 {index} — ABNORMAL HIGH

## 2024-08-21 LAB — HSV 2 INHIBITION: HSV 2 IGG INHIBITION,IA: POSITIVE — AB

## 2024-08-22 ENCOUNTER — Ambulatory Visit: Payer: Self-pay | Admitting: Obstetrics and Gynecology

## 2024-08-26 ENCOUNTER — Encounter: Payer: Self-pay | Admitting: Physical Medicine and Rehabilitation

## 2024-09-20 ENCOUNTER — Other Ambulatory Visit (HOSPITAL_COMMUNITY): Payer: Self-pay

## 2024-09-20 ENCOUNTER — Telehealth: Payer: Self-pay | Admitting: Pharmacy Technician

## 2024-09-20 NOTE — Telephone Encounter (Signed)
" ° °  Pharmacy Patient Advocate Encounter   Received notification from [facility] that prior authorization for REPATHA  is required/requested.   Insurance verification completed.   The patient is insured through [facility].   Per test claim: PA required; PA submitted to above mentioned insurance via Latent Key/confirmation #/EOC [id number] Status is pending  "

## 2024-09-30 ENCOUNTER — Encounter: Admitting: Physical Medicine and Rehabilitation

## 2025-01-05 ENCOUNTER — Ambulatory Visit: Admitting: Obstetrics and Gynecology

## 2025-01-16 ENCOUNTER — Ambulatory Visit: Admitting: Obstetrics and Gynecology

## 8387-04-19 DEATH — deceased
